# Patient Record
Sex: Female | Born: 1970 | Race: Black or African American | Hispanic: No | Smoking: Never smoker
Health system: Southern US, Community
[De-identification: ages and names within clinical notes are randomized; demographics above are authoritative.]

## PROBLEM LIST (undated history)

## (undated) DIAGNOSIS — D649 Anemia, unspecified: Secondary | ICD-10-CM

## (undated) DIAGNOSIS — M1990 Unspecified osteoarthritis, unspecified site: Secondary | ICD-10-CM

## (undated) DIAGNOSIS — C801 Malignant (primary) neoplasm, unspecified: Secondary | ICD-10-CM

## (undated) DIAGNOSIS — N189 Chronic kidney disease, unspecified: Secondary | ICD-10-CM

## (undated) HISTORY — PX: IRRIGATION AND DEBRIDEMENT SEBACEOUS CYST: SHX5255

## (undated) HISTORY — PX: OTHER SURGICAL HISTORY: SHX169

---

## 1997-10-07 ENCOUNTER — Ambulatory Visit (HOSPITAL_BASED_OUTPATIENT_CLINIC_OR_DEPARTMENT_OTHER): Admission: RE | Admit: 1997-10-07 | Discharge: 1997-10-07 | Payer: Self-pay | Admitting: Plastic Surgery

## 1997-10-26 ENCOUNTER — Ambulatory Visit (HOSPITAL_BASED_OUTPATIENT_CLINIC_OR_DEPARTMENT_OTHER): Admission: RE | Admit: 1997-10-26 | Discharge: 1997-10-26 | Payer: Self-pay | Admitting: Plastic Surgery

## 1998-01-12 ENCOUNTER — Ambulatory Visit (HOSPITAL_COMMUNITY): Admission: RE | Admit: 1998-01-12 | Discharge: 1998-01-12 | Payer: Self-pay | Admitting: Gastroenterology

## 1998-03-20 ENCOUNTER — Inpatient Hospital Stay (HOSPITAL_COMMUNITY): Admission: RE | Admit: 1998-03-20 | Discharge: 1998-03-22 | Payer: Self-pay | Admitting: Plastic Surgery

## 1998-04-06 ENCOUNTER — Ambulatory Visit: Admission: RE | Admit: 1998-04-06 | Discharge: 1998-04-06 | Payer: Self-pay | Admitting: Plastic Surgery

## 1998-04-21 ENCOUNTER — Inpatient Hospital Stay (HOSPITAL_COMMUNITY): Admission: RE | Admit: 1998-04-21 | Discharge: 1998-04-25 | Payer: Self-pay | Admitting: Plastic Surgery

## 1999-02-06 ENCOUNTER — Encounter: Payer: Self-pay | Admitting: *Deleted

## 1999-02-06 ENCOUNTER — Encounter: Admission: RE | Admit: 1999-02-06 | Discharge: 1999-02-06 | Payer: Self-pay | Admitting: *Deleted

## 2001-05-18 ENCOUNTER — Other Ambulatory Visit: Admission: RE | Admit: 2001-05-18 | Discharge: 2001-05-18 | Payer: Self-pay | Admitting: *Deleted

## 2002-05-21 ENCOUNTER — Other Ambulatory Visit: Admission: RE | Admit: 2002-05-21 | Discharge: 2002-05-21 | Payer: Self-pay | Admitting: Obstetrics & Gynecology

## 2003-08-26 ENCOUNTER — Encounter: Admission: RE | Admit: 2003-08-26 | Discharge: 2003-08-26 | Payer: Self-pay | Admitting: Obstetrics & Gynecology

## 2008-01-22 HISTORY — PX: LAPAROSCOPIC GASTRIC BANDING: SHX1100

## 2008-09-05 ENCOUNTER — Ambulatory Visit (HOSPITAL_COMMUNITY): Admission: RE | Admit: 2008-09-05 | Discharge: 2008-09-05 | Payer: Self-pay | Admitting: General Surgery

## 2008-09-07 ENCOUNTER — Encounter: Admission: RE | Admit: 2008-09-07 | Discharge: 2008-11-24 | Payer: Self-pay | Admitting: General Surgery

## 2008-12-12 ENCOUNTER — Ambulatory Visit (HOSPITAL_COMMUNITY): Admission: RE | Admit: 2008-12-12 | Discharge: 2008-12-12 | Payer: Self-pay | Admitting: General Surgery

## 2009-01-02 ENCOUNTER — Encounter: Admission: RE | Admit: 2009-01-02 | Discharge: 2009-01-18 | Payer: Self-pay | Admitting: General Surgery

## 2009-02-20 ENCOUNTER — Encounter: Admission: RE | Admit: 2009-02-20 | Discharge: 2009-05-21 | Payer: Self-pay | Admitting: General Surgery

## 2009-04-10 ENCOUNTER — Encounter: Admission: RE | Admit: 2009-04-10 | Discharge: 2009-07-09 | Payer: Self-pay | Admitting: General Surgery

## 2010-04-25 LAB — COMPREHENSIVE METABOLIC PANEL
AST: 17 U/L (ref 0–37)
Albumin: 3.4 g/dL — ABNORMAL LOW (ref 3.5–5.2)
CO2: 28 mEq/L (ref 19–32)
Calcium: 9.1 mg/dL (ref 8.4–10.5)
Creatinine, Ser: 0.63 mg/dL (ref 0.4–1.2)
GFR calc Af Amer: >60 mL/min (ref >60)
GFR calc non Af Amer: >60 mL/min (ref >60)
Total Protein: 7.8 g/dL (ref 6.0–8.3)

## 2010-04-25 LAB — DIFFERENTIAL
Basophils Relative: 1 % (ref 0–1)
Eosinophils Absolute: 0.1 10*3/uL (ref 0.0–0.7)
Lymphocytes Relative: 34 % (ref 12–46)
Monocytes Relative: 9 % (ref 3–12)
Neutrophils Relative %: 54 % (ref 43–77)

## 2010-04-25 LAB — PREGNANCY, URINE: Preg Test, Ur: NEGATIVE

## 2010-04-25 LAB — CBC
HCT: 31 % — ABNORMAL LOW (ref 36.0–46.0)
Hemoglobin: 9.8 g/dL — ABNORMAL LOW (ref 12.0–15.0)
MCHC: 31.6 g/dL (ref 30.0–36.0)
MCV: 69.1 fL — ABNORMAL LOW (ref 78.0–100.0)
Platelets: 221 10*3/uL (ref 150–400)
RBC: 4.49 MIL/uL (ref 3.87–5.11)
RDW: 20.9 % — ABNORMAL HIGH (ref 11.5–15.5)
WBC: 6.5 10*3/uL (ref 4.0–10.5)

## 2011-04-29 ENCOUNTER — Encounter (HOSPITAL_COMMUNITY): Payer: Self-pay | Admitting: *Deleted

## 2011-04-29 ENCOUNTER — Emergency Department (HOSPITAL_COMMUNITY): Payer: No Typology Code available for payment source

## 2011-04-29 ENCOUNTER — Inpatient Hospital Stay (HOSPITAL_COMMUNITY)
Admission: EM | Admit: 2011-04-29 | Discharge: 2011-05-06 | DRG: 493 | Disposition: A | Discharge status: Rehabilitation Facility | Payer: No Typology Code available for payment source | Attending: General Surgery | Admitting: General Surgery

## 2011-04-29 DIAGNOSIS — R011 Cardiac murmur, unspecified: Secondary | ICD-10-CM

## 2011-04-29 DIAGNOSIS — S2249XA Multiple fractures of ribs, unspecified side, initial encounter for closed fracture: Secondary | ICD-10-CM | POA: Diagnosis present

## 2011-04-29 DIAGNOSIS — S0181XA Laceration without foreign body of other part of head, initial encounter: Secondary | ICD-10-CM

## 2011-04-29 DIAGNOSIS — Z9884 Bariatric surgery status: Secondary | ICD-10-CM

## 2011-04-29 DIAGNOSIS — Y92410 Unspecified street and highway as the place of occurrence of the external cause: Secondary | ICD-10-CM

## 2011-04-29 DIAGNOSIS — S270XXA Traumatic pneumothorax, initial encounter: Secondary | ICD-10-CM

## 2011-04-29 DIAGNOSIS — S8251XA Displaced fracture of medial malleolus of right tibia, initial encounter for closed fracture: Secondary | ICD-10-CM | POA: Diagnosis not present

## 2011-04-29 DIAGNOSIS — S42409A Unspecified fracture of lower end of unspecified humerus, initial encounter for closed fracture: Principal | ICD-10-CM | POA: Diagnosis present

## 2011-04-29 DIAGNOSIS — S8253XA Displaced fracture of medial malleolus of unspecified tibia, initial encounter for closed fracture: Secondary | ICD-10-CM | POA: Diagnosis present

## 2011-04-29 DIAGNOSIS — S27329A Contusion of lung, unspecified, initial encounter: Secondary | ICD-10-CM | POA: Diagnosis present

## 2011-04-29 DIAGNOSIS — S27321A Contusion of lung, unilateral, initial encounter: Secondary | ICD-10-CM | POA: Diagnosis present

## 2011-04-29 DIAGNOSIS — R55 Syncope and collapse: Secondary | ICD-10-CM | POA: Diagnosis present

## 2011-04-29 DIAGNOSIS — S0180XA Unspecified open wound of other part of head, initial encounter: Secondary | ICD-10-CM | POA: Diagnosis present

## 2011-04-29 DIAGNOSIS — S2241XA Multiple fractures of ribs, right side, initial encounter for closed fracture: Secondary | ICD-10-CM | POA: Diagnosis present

## 2011-04-29 DIAGNOSIS — S42402A Unspecified fracture of lower end of left humerus, initial encounter for closed fracture: Secondary | ICD-10-CM | POA: Diagnosis present

## 2011-04-29 DIAGNOSIS — D62 Acute posthemorrhagic anemia: Secondary | ICD-10-CM | POA: Diagnosis not present

## 2011-04-29 DIAGNOSIS — S060X0A Concussion without loss of consciousness, initial encounter: Secondary | ICD-10-CM | POA: Diagnosis present

## 2011-04-29 DIAGNOSIS — E876 Hypokalemia: Secondary | ICD-10-CM | POA: Diagnosis present

## 2011-04-29 HISTORY — DX: Malignant (primary) neoplasm, unspecified: C80.1

## 2011-04-29 HISTORY — DX: Anemia, unspecified: D64.9

## 2011-04-29 HISTORY — DX: Chronic kidney disease, unspecified: N18.9

## 2011-04-29 LAB — DIFFERENTIAL
Basophils Absolute: 0 10*3/uL (ref 0.0–0.1)
Lymphocytes Relative: 8 % — ABNORMAL LOW (ref 12–46)
Monocytes Relative: 7 % (ref 3–12)
Neutro Abs: 12.2 10*3/uL — ABNORMAL HIGH (ref 1.7–7.7)

## 2011-04-29 LAB — URINE MICROSCOPIC-ADD ON

## 2011-04-29 LAB — URINALYSIS, ROUTINE W REFLEX MICROSCOPIC
Nitrite: NEGATIVE
Specific Gravity, Urine: 1.023 (ref 1.005–1.030)
Urobilinogen, UA: 0.2 mg/dL (ref 0.0–1.0)

## 2011-04-29 LAB — POCT I-STAT, CHEM 8
BUN: 4 mg/dL — ABNORMAL LOW (ref 6–23)
Calcium, Ion: 1.14 mmol/L (ref 1.12–1.32)
Chloride: 103 mEq/L (ref 96–112)
Creatinine, Ser: 0.8 mg/dL (ref 0.50–1.10)
Glucose, Bld: 108 mg/dL — ABNORMAL HIGH (ref 70–99)

## 2011-04-29 LAB — CBC
HCT: 32.8 % — ABNORMAL LOW (ref 36.0–46.0)
Platelets: 162 10*3/uL (ref 150–400)
RDW: 14.6 % (ref 11.5–15.5)
WBC: 14.4 10*3/uL — ABNORMAL HIGH (ref 4.0–10.5)

## 2011-04-29 MED ORDER — SODIUM CHLORIDE 0.9 % IV SOLN
INTRAVENOUS | Status: DC
  Administered 2011-04-29: 20:00:00 via INTRAVENOUS

## 2011-04-29 MED ORDER — IOHEXOL 300 MG/ML  SOLN
100.0000 mL | Freq: Once | INTRAMUSCULAR | Status: AC | PRN
  Administered 2011-04-29: 100 mL via INTRAVENOUS

## 2011-04-29 MED ORDER — MORPHINE SULFATE 4 MG/ML IJ SOLN
4.0000 mg | Freq: Once | INTRAMUSCULAR | Status: AC
  Administered 2011-04-29: 4 mg via INTRAVENOUS
  Filled 2011-04-29: qty 1

## 2011-04-29 MED ORDER — SODIUM CHLORIDE 0.9 % IV BOLUS (SEPSIS)
500.0000 mL | Freq: Once | INTRAVENOUS | Status: AC
  Administered 2011-04-29: 500 mL via INTRAVENOUS

## 2011-04-29 MED ORDER — IOHEXOL 300 MG/ML  SOLN: 100.0000 mL | Freq: Once | INTRAMUSCULAR | Status: DC | PRN

## 2011-04-29 MED ORDER — HYDROMORPHONE HCL PF 1 MG/ML IJ SOLN
1.0000 mg | Freq: Once | INTRAMUSCULAR | Status: AC
  Administered 2011-04-29: 1 mg via INTRAVENOUS
  Filled 2011-04-29: qty 1
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
## 2011-04-29 NOTE — Progress Notes (Signed)
Orthopedic Tech Progress Note Patient Details:  Patty [name] January 12, 1971 [id number]  Other Ortho Devices Type of Ortho Device: Other (comment) (foam arm sling) Ortho Device Location: left arm Ortho Device Interventions: Application   Basilio Meadow 04/29/2011, 10:40 PM
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
## 2011-04-30 ENCOUNTER — Inpatient Hospital Stay (HOSPITAL_COMMUNITY): Payer: No Typology Code available for payment source

## 2011-04-30 ENCOUNTER — Other Ambulatory Visit: Payer: Self-pay | Admitting: Orthopedic Surgery

## 2011-04-30 ENCOUNTER — Encounter (HOSPITAL_COMMUNITY): Payer: Self-pay | Admitting: *Deleted

## 2011-04-30 DIAGNOSIS — S2241XA Multiple fractures of ribs, right side, initial encounter for closed fracture: Secondary | ICD-10-CM | POA: Diagnosis present

## 2011-04-30 DIAGNOSIS — S27321A Contusion of lung, unilateral, initial encounter: Secondary | ICD-10-CM | POA: Diagnosis present

## 2011-04-30 DIAGNOSIS — S8251XA Displaced fracture of medial malleolus of right tibia, initial encounter for closed fracture: Secondary | ICD-10-CM | POA: Diagnosis not present

## 2011-04-30 DIAGNOSIS — D62 Acute posthemorrhagic anemia: Secondary | ICD-10-CM | POA: Diagnosis not present

## 2011-04-30 DIAGNOSIS — R55 Syncope and collapse: Secondary | ICD-10-CM | POA: Diagnosis present

## 2011-04-30 DIAGNOSIS — S42402A Unspecified fracture of lower end of left humerus, initial encounter for closed fracture: Secondary | ICD-10-CM | POA: Diagnosis present

## 2011-04-30 LAB — COMPREHENSIVE METABOLIC PANEL
AST: 42 U/L — ABNORMAL HIGH (ref 0–37)
Albumin: 3.4 g/dL — ABNORMAL LOW (ref 3.5–5.2)
Alkaline Phosphatase: 49 U/L (ref 39–117)
BUN: 6 mg/dL (ref 6–23)
CO2: 26 mEq/L (ref 19–32)
Chloride: 104 mEq/L (ref 96–112)
Potassium: 3.3 mEq/L — ABNORMAL LOW (ref 3.5–5.1)
Total Bilirubin: 0.5 mg/dL (ref 0.3–1.2)

## 2011-04-30 LAB — CBC
MCH: 27 pg (ref 26.0–34.0)
MCH: 26.4 pg (ref 26.0–34.0)
MCV: 80.2 fL (ref 78.0–100.0)
Platelets: 180 10*3/uL (ref 150–400)
Platelets: 172 10*3/uL (ref 150–400)
RBC: 3.78 MIL/uL — ABNORMAL LOW (ref 3.87–5.11)
RDW: 14.6 % (ref 11.5–15.5)
WBC: 8.5 10*3/uL (ref 4.0–10.5)

## 2011-04-30 LAB — SURGICAL PCR SCREEN
MRSA, PCR: NEGATIVE
Staphylococcus aureus: NEGATIVE

## 2011-04-30 MED ORDER — SODIUM CHLORIDE 0.9 % IV SOLN: INTRAVENOUS | Status: DC

## 2011-04-30 MED ORDER — ONDANSETRON HCL 4 MG/2ML IJ SOLN: 4.0000 mg | Freq: Four times a day (QID) | INTRAMUSCULAR | Status: DC | PRN

## 2011-04-30 MED ORDER — FENTANYL CITRATE 0.05 MG/ML IJ SOLN
50.0000 ug | INTRAMUSCULAR | Status: DC | PRN
  Administered 2011-05-01 (×2): 50 ug via INTRAVENOUS

## 2011-04-30 MED ORDER — MORPHINE SULFATE 4 MG/ML IJ SOLN
4.0000 mg | INTRAMUSCULAR | Status: DC | PRN
  Administered 2011-04-30: 4 mg via INTRAVENOUS
  Filled 2011-04-30: qty 1

## 2011-04-30 MED ORDER — OXYCODONE HCL 5 MG PO TABS
5.0000 mg | ORAL_TABLET | ORAL | Status: DC | PRN
  Administered 2011-04-30: 10 mg via ORAL
  Administered 2011-05-01 – 2011-05-06 (×24): 15 mg via ORAL
  Filled 2011-04-30 (×7): qty 3
  Filled 2011-04-30: qty 2
  Filled 2011-04-30 (×17): qty 3

## 2011-04-30 MED ORDER — OXYCODONE HCL 5 MG PO TABS: 5.0000 mg | ORAL_TABLET | ORAL | Status: DC | PRN

## 2011-04-30 MED ORDER — MIDAZOLAM HCL 2 MG/2ML IJ SOLN: 0.5000 mg | INTRAMUSCULAR | Status: DC | PRN

## 2011-04-30 MED ORDER — MORPHINE SULFATE 2 MG/ML IJ SOLN
2.0000 mg | INTRAMUSCULAR | Status: DC | PRN
Start: 2011-04-30 — End: 2011-05-03
  Administered 2011-04-30 – 2011-05-03 (×11): 2 mg via INTRAVENOUS
  Filled 2011-04-30 (×11): qty 1

## 2011-04-30 MED ORDER — ENOXAPARIN SODIUM 40 MG/0.4ML ~~LOC~~ SOLN
40.0000 mg | SUBCUTANEOUS | Status: DC
  Administered 2011-04-30 – 2011-05-05 (×5): 40 mg via SUBCUTANEOUS
  Filled 2011-04-30 (×7): qty 0.4

## 2011-04-30 NOTE — Progress Notes (Signed)
Patient seen and examined.  Agree with PA's note.  
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
## 2011-04-30 NOTE — Progress Notes (Signed)
X-ray revealed transverse fracture of the medial malleolus on the right.  I called Dr. Ave Filter with Orthopedics who will evaluate the patient tonight.  She is NPO after midnight already for Dr. Ronie Spies surgery tomorrow.  Patty [name]. Corliss Skains, MD, [facility]  04/30/2011 8:03 PM
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
## 2011-04-30 NOTE — Progress Notes (Signed)
UR completed 

## 2011-05-01 ENCOUNTER — Inpatient Hospital Stay (HOSPITAL_COMMUNITY): Payer: No Typology Code available for payment source | Admitting: Anesthesiology

## 2011-05-01 ENCOUNTER — Encounter (HOSPITAL_COMMUNITY): Payer: Self-pay | Admitting: Anesthesiology

## 2011-05-01 ENCOUNTER — Encounter (HOSPITAL_COMMUNITY): Admission: EM | Disposition: A | Discharge status: Rehabilitation Facility | Payer: Self-pay | Source: Home / Self Care

## 2011-05-01 DIAGNOSIS — R072 Precordial pain: Secondary | ICD-10-CM

## 2011-05-01 HISTORY — PX: ORIF HUMERUS FRACTURE: SHX2126

## 2011-05-01 HISTORY — PX: ORIF ANKLE FRACTURE: SHX5408

## 2011-05-01 LAB — CBC
MCHC: 33.6 g/dL (ref 30.0–36.0)
RDW: 15 % (ref 11.5–15.5)

## 2011-05-01 SURGERY — OPEN REDUCTION INTERNAL FIXATION (ORIF) DISTAL HUMERUS FRACTURE
Anesthesia: General | Site: Leg Lower | Laterality: Right | Wound class: Clean

## 2011-05-01 MED ORDER — CEFAZOLIN SODIUM 1-5 GM-% IV SOLN
INTRAVENOUS | Status: DC | PRN
  Administered 2011-05-01: 2 g via INTRAVENOUS

## 2011-05-01 MED ORDER — 0.9 % SODIUM CHLORIDE (POUR BTL) OPTIME
TOPICAL | Status: DC | PRN
  Administered 2011-05-01: 1000 mL

## 2011-05-01 MED ORDER — VECURONIUM BROMIDE 10 MG IV SOLR
INTRAVENOUS | Status: DC | PRN
  Administered 2011-05-01: 1 mg via INTRAVENOUS
  Administered 2011-05-01: 2 mg via INTRAVENOUS
  Administered 2011-05-01: 1 mg via INTRAVENOUS

## 2011-05-01 MED ORDER — ONDANSETRON HCL 4 MG/2ML IJ SOLN: 4.0000 mg | Freq: Once | INTRAMUSCULAR | Status: DC | PRN

## 2011-05-01 MED ORDER — PROPOFOL 10 MG/ML IV EMUL
INTRAVENOUS | Status: DC | PRN
  Administered 2011-05-01: 150 mg via INTRAVENOUS

## 2011-05-01 MED ORDER — MIDAZOLAM HCL 5 MG/5ML IJ SOLN
INTRAMUSCULAR | Status: DC | PRN
  Administered 2011-05-01: 2 mg via INTRAVENOUS

## 2011-05-01 MED ORDER — MORPHINE SULFATE 4 MG/ML IJ SOLN: 0.0500 mg/kg | INTRAMUSCULAR | Status: DC | PRN

## 2011-05-01 MED ORDER — ONDANSETRON HCL 4 MG/2ML IJ SOLN
INTRAMUSCULAR | Status: DC | PRN
  Administered 2011-05-01: 4 mg via INTRAVENOUS

## 2011-05-01 MED ORDER — GLYCOPYRROLATE 0.2 MG/ML IJ SOLN
INTRAMUSCULAR | Status: DC | PRN
  Administered 2011-05-01: .8 mg via INTRAVENOUS

## 2011-05-01 MED ORDER — NEOSTIGMINE METHYLSULFATE 1 MG/ML IJ SOLN
INTRAMUSCULAR | Status: DC | PRN
  Administered 2011-05-01: 4 mg via INTRAVENOUS

## 2011-05-01 MED ORDER — ROCURONIUM BROMIDE 100 MG/10ML IV SOLN
INTRAVENOUS | Status: DC | PRN
  Administered 2011-05-01: 40 mg via INTRAVENOUS
  Administered 2011-05-01: 10 mg via INTRAVENOUS

## 2011-05-01 MED ORDER — FENTANYL CITRATE 0.05 MG/ML IJ SOLN
INTRAMUSCULAR | Status: DC | PRN
  Administered 2011-05-01 (×2): 50 ug via INTRAVENOUS
  Administered 2011-05-01 (×2): 100 ug via INTRAVENOUS
  Administered 2011-05-01 (×2): 50 ug via INTRAVENOUS

## 2011-05-01 MED ORDER — CEFAZOLIN SODIUM 1-5 GM-% IV SOLN
1.0000 g | INTRAVENOUS | Status: DC
  Filled 2011-05-01: qty 50

## 2011-05-01 MED ORDER — LACTATED RINGERS IV SOLN
INTRAVENOUS | Status: DC | PRN
  Administered 2011-05-01 (×3): via INTRAVENOUS

## 2011-05-01 MED ORDER — HYDROMORPHONE HCL PF 1 MG/ML IJ SOLN
INTRAMUSCULAR | Status: AC
  Filled 2011-05-01: qty 1

## 2011-05-01 MED ORDER — FENTANYL CITRATE 0.05 MG/ML IJ SOLN
INTRAMUSCULAR | Status: AC
  Filled 2011-05-01: qty 2

## 2011-05-01 MED ORDER — CHLORHEXIDINE GLUCONATE 4 % EX LIQD: 60.0000 mL | Freq: Once | CUTANEOUS | Status: DC

## 2011-05-01 MED ORDER — FENTANYL CITRATE 0.05 MG/ML IJ SOLN: 100.0000 ug | Freq: Once | INTRAMUSCULAR | Status: DC

## 2011-05-01 MED ORDER — HYDROMORPHONE HCL PF 1 MG/ML IJ SOLN
0.2500 mg | INTRAMUSCULAR | Status: DC | PRN
  Administered 2011-05-01 (×4): 0.5 mg via INTRAVENOUS

## 2011-05-01 SURGICAL SUPPLY — 91 items
18gauge wire (Wire) ×1 IMPLANT
3.5MD screw 44mm ×1 IMPLANT
BANDAGE ELASTIC 3 VELCRO ST LF (GAUZE/BANDAGES/DRESSINGS) ×3 IMPLANT
BANDAGE ELASTIC 4 VELCRO ST LF (GAUZE/BANDAGES/DRESSINGS) ×6 IMPLANT
BANDAGE ELASTIC 6 VELCRO ST LF (GAUZE/BANDAGES/DRESSINGS) ×3 IMPLANT
BANDAGE ESMARK 6X9 LF (GAUZE/BANDAGES/DRESSINGS) ×2 IMPLANT
BANDAGE GAUZE ELAST BULKY 4 IN (GAUZE/BANDAGES/DRESSINGS) ×6 IMPLANT
BIT DRILL 2.5X2.75 QC CALB (BIT) ×2 IMPLANT
BIT DRILL 2.9 CANN QC NONSTRL (BIT) ×1 IMPLANT
BIT DRILL CALIBRATED 2.7 (BIT) ×2 IMPLANT
BIT DRILL CANN 2.7X625 NONSTRL (BIT) ×2 IMPLANT
BLADE AVERAGE 25X9 (BLADE) ×1 IMPLANT
BNDG CMPR 9X4 STRL LF SNTH (GAUZE/BANDAGES/DRESSINGS) ×2
BNDG CMPR 9X6 STRL LF SNTH (GAUZE/BANDAGES/DRESSINGS) ×2
BNDG ESMARK 4X9 LF (GAUZE/BANDAGES/DRESSINGS) ×3 IMPLANT
BNDG ESMARK 6X9 LF (GAUZE/BANDAGES/DRESSINGS) ×3
CLOTH BEACON ORANGE TIMEOUT ST (SAFETY) ×6 IMPLANT
CORDS BIPOLAR (ELECTRODE) ×3 IMPLANT
COVER SURGICAL LIGHT HANDLE (MISCELLANEOUS) ×6 IMPLANT
CUFF TOURNIQUET SINGLE 18IN (TOURNIQUET CUFF) IMPLANT
CUFF TOURNIQUET SINGLE 24IN (TOURNIQUET CUFF) IMPLANT
CUFF TOURNIQUET SINGLE 34IN LL (TOURNIQUET CUFF) IMPLANT
CUFF TOURNIQUET SINGLE 44IN (TOURNIQUET CUFF) IMPLANT
DECANTER SPIKE VIAL GLASS SM (MISCELLANEOUS) IMPLANT
DRAPE OEC MINIVIEW 54X84 (DRAPES) IMPLANT
DRAPE SURG 17X23 STRL (DRAPES) ×3 IMPLANT
DRAPE U-SHAPE 47X51 STRL (DRAPES) ×3 IMPLANT
DRSG PAD ABDOMINAL 8X10 ST (GAUZE/BANDAGES/DRESSINGS) ×3 IMPLANT
DURAPREP 26ML APPLICATOR (WOUND CARE) ×6 IMPLANT
ELECT REM PT RETURN 9FT ADLT (ELECTROSURGICAL) ×3
ELECTRODE REM PT RTRN 9FT ADLT (ELECTROSURGICAL) ×2 IMPLANT
GAUZE XEROFORM 1X8 LF (GAUZE/BANDAGES/DRESSINGS) ×6 IMPLANT
GLOVE BIO SURGEON STRL SZ7 (GLOVE) ×3 IMPLANT
GLOVE BIO SURGEON STRL SZ7.5 (GLOVE) ×3 IMPLANT
GLOVE BIO SURGEON STRL SZ8.5 (GLOVE) ×3 IMPLANT
GLOVE BIOGEL PI IND STRL 8 (GLOVE) ×2 IMPLANT
GLOVE BIOGEL PI INDICATOR 8 (GLOVE) ×1
GOWN SRG XL XLNG 56XLVL 4 (GOWN DISPOSABLE) ×2 IMPLANT
GOWN STRL NON-REIN LRG LVL3 (GOWN DISPOSABLE) ×12 IMPLANT
GOWN STRL NON-REIN XL XLG LVL4 (GOWN DISPOSABLE) ×3
GUIDEWIRE THREADED 150MM (WIRE) ×1 IMPLANT
K-WIRE ACE 1.6X6 (WIRE) ×6
K-WIRE FIXATION 2.0X6 (WIRE) ×15
KIT BASIN OR (CUSTOM PROCEDURE TRAY) ×6 IMPLANT
KIT ROOM TURNOVER OR (KITS) ×6 IMPLANT
KWIRE ACE 1.6X6 (WIRE) IMPLANT
KWIRE FIXATION 2.0X6 (WIRE) IMPLANT
MANIFOLD NEPTUNE II (INSTRUMENTS) ×6 IMPLANT
NDL HYPO 25GX1X1/2 BEV (NEEDLE) IMPLANT
NEEDLE 22X1 1/2 (OR ONLY) (NEEDLE) IMPLANT
NEEDLE HYPO 25GX1X1/2 BEV (NEEDLE) IMPLANT
NS IRRIG 1000ML POUR BTL (IV SOLUTION) ×6 IMPLANT
PACK ORTHO EXTREMITY (CUSTOM PROCEDURE TRAY) ×6 IMPLANT
PAD ARMBOARD 7.5X6 YLW CONV (MISCELLANEOUS) ×12 IMPLANT
PAD CAST 3X4 CTTN HI CHSV (CAST SUPPLIES) ×2 IMPLANT
PAD CAST 4YDX4 CTTN HI CHSV (CAST SUPPLIES) ×6 IMPLANT
PADDING CAST COTTON 3X4 STRL (CAST SUPPLIES) ×3
PADDING CAST COTTON 4X4 STRL (CAST SUPPLIES) ×9
PLATE DIST HUM MEDIAL LT SM (Plate) ×1 IMPLANT
PLATE LOCK LT SM (Plate) ×1 IMPLANT
SCREW  RD HEAD THR 4.0 34 LTH (Screw) ×1 IMPLANT
SCREW 3.5MM CORT LP 34MM (Screw) ×1 IMPLANT
SCREW CANN TI S-THRD 4.0X36 (Screw) ×1 IMPLANT
SCREW CORT 3.5X40 (Screw) ×1 IMPLANT
SCREW CORT T15 28X3.5XST LCK (Screw) IMPLANT
SCREW CORTICAL 3.5X28MM (Screw) ×6 IMPLANT
SCREW CORTICAL LOW PROF 3.5X20 (Screw) ×1 IMPLANT
SCREW LOCK CORT STAR 3.5X16 (Screw) ×1 IMPLANT
SCREW LOCK CORT STAR 3.5X20 (Screw) ×1 IMPLANT
SCREW LOCK CORT STAR 3.5X22 (Screw) ×1 IMPLANT
SCREW LOCK CORT STAR 3.5X28 (Screw) ×1 IMPLANT
SCREW LOCK CORT STAR 3.5X30 (Screw) ×1 IMPLANT
SCREW LOW PROFILE 18MMX3.5MM (Screw) ×1 IMPLANT
SCREW LOW PROFILE 22MMX3.5MM (Screw) ×2 IMPLANT
SPONGE GAUZE 4X4 12PLY (GAUZE/BANDAGES/DRESSINGS) ×6 IMPLANT
SPONGE LAP 4X18 X RAY DECT (DISPOSABLE) ×12 IMPLANT
SPONGE SCRUB IODOPHOR (GAUZE/BANDAGES/DRESSINGS) ×3 IMPLANT
STAPLER VISISTAT 35W (STAPLE) ×3 IMPLANT
STRIP CLOSURE SKIN 1/2X4 (GAUZE/BANDAGES/DRESSINGS) IMPLANT
SUCTION FRAZIER TIP 10 FR DISP (SUCTIONS) ×3 IMPLANT
SUT STEEL 7 (SUTURE) ×1 IMPLANT
SUT VIC AB 2-0 CTB1 (SUTURE) ×3 IMPLANT
SUT VIC AB 3-0 FS2 27 (SUTURE) ×6 IMPLANT
SYR CONTROL 10ML LL (SYRINGE) IMPLANT
TOWEL OR 17X24 6PK STRL BLUE (TOWEL DISPOSABLE) ×6 IMPLANT
TOWEL OR 17X26 10 PK STRL BLUE (TOWEL DISPOSABLE) ×6 IMPLANT
TUBE CONNECTING 12X1/4 (SUCTIONS) ×3 IMPLANT
UNDERPAD 30X30 INCONTINENT (UNDERPADS AND DIAPERS) ×3 IMPLANT
WASHER 3.5MM (Orthopedic Implant) ×1 IMPLANT
WATER STERILE IRR 1000ML POUR (IV SOLUTION) ×6 IMPLANT
YANKAUER SUCT BULB TIP NO VENT (SUCTIONS) IMPLANT

## 2011-05-01 NOTE — Transfer of Care (Signed)
Immediate Anesthesia Transfer of Care Note  Patient: Patty [name]  Procedure(s) Performed: Procedure(s) (LRB): OPEN REDUCTION INTERNAL FIXATION (ORIF) DISTAL HUMERUS FRACTURE (Left) OPEN REDUCTION INTERNAL FIXATION (ORIF) ANKLE FRACTURE (Right)  Patient Location: PACU  Anesthesia Type: General  Level of Consciousness: awake and patient cooperative  Airway & Oxygen Therapy: Patient Spontanous Breathing and Patient connected to face mask oxygen  Post-op Assessment: Report given to PACU RN  Post vital signs: Reviewed and stable  Complications: No apparent anesthesia complications

## 2011-05-01 NOTE — OR Nursing (Signed)
Open Reduction Internal Fixation right ankle, times: start 1320, end:1342.
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
## 2011-05-01 NOTE — Anesthesia Postprocedure Evaluation (Signed)
  Anesthesia Post-op Note  Patient: Patty [name]  Procedure(s) Performed: Procedure(s) (LRB): OPEN REDUCTION INTERNAL FIXATION (ORIF) DISTAL HUMERUS FRACTURE (Left) OPEN REDUCTION INTERNAL FIXATION (ORIF) ANKLE FRACTURE (Right)  Patient Location: PACU  Anesthesia Type: General  Level of Consciousness: awake  Airway and Oxygen Therapy: Patient Spontanous Breathing  Post-op Pain: mild  Post-op Assessment: Post-op Vital signs reviewed  Post-op Vital Signs: Reviewed  Complications: No apparent anesthesia complications
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
## 2011-05-01 NOTE — Brief Op Note (Signed)
04/29/2011 - 05/01/2011  4:33 PM  PATIENT:  Patty [name]  41 y.o. female  PRE-OPERATIVE DIAGNOSIS:  left distal humerus fracture, right ankle fracture  POST-OPERATIVE DIAGNOSIS:  left distal humerus fracture, right ankle fracture  PROCEDURE:  Procedure(s) (LRB): OPEN REDUCTION INTERNAL FIXATION (ORIF) DISTAL HUMERUS FRACTURE (Left) OPEN REDUCTION INTERNAL FIXATION (ORIF) ANKLE FRACTURE (Right)  SURGEON:  Surgeon(s) and Role: Panel 1:    * Marlowe Shores, MD - Primary    * Tami Ribas, MD - Assisting  Panel 2:    * Nestor Lewandowsky, MD - Primary  PHYSICIAN ASSISTANT:   ASSISTANTS: Betha Loa   ANESTHESIA:   general  EBL:  Total I/O In: 2005 [P.O.:5; I.V.:2000] Out: 400 [Urine:400]  BLOOD ADMINISTERED:none  DRAINS: none   LOCAL MEDICATIONS USED:  NONE  SPECIMEN:  No Specimen  DISPOSITION OF SPECIMEN:  N/A  COUNTS:  YES  TOURNIQUET:   Total Tourniquet Time Documented: Upper Arm (Left) - 169 minutes  DICTATION: .Other Dictation: Dictation Number [id number]  PLAN OF CARE: Admit to inpatient   PATIENT DISPOSITION:  PACU - hemodynamically stable.   Delay start of Pharmacological VTE agent (>24hrs) due to surgical blood loss or risk of bleeding: yes

## 2011-05-01 NOTE — Interval H&P Note (Signed)
History and Physical Interval Note:  05/01/2011 7:53 AM  Patty [name]  has presented today for surgery, with the diagnosis of left distal humerus fracture  The various methods of treatment have been discussed with the patient and family. After consideration of risks, benefits and other options for treatment, the patient has consented to  Procedure(s) (LRB): OPEN REDUCTION INTERNAL FIXATION (ORIF) DISTAL HUMERUS FRACTURE (Left) OPEN REDUCTION INTERNAL FIXATION (ORIF) ANKLE FRACTURE (Right) as a surgical intervention .  The patients' history has been reviewed, patient examined, no change in status, stable for surgery.  I have reviewed the patients' chart and labs.  Questions were answered to the patient's satisfaction.     Nestor Lewandowsky

## 2011-05-01 NOTE — Op Note (Signed)
See dictated note [phone]

## 2011-05-01 NOTE — Progress Notes (Signed)
Pt arrived to 4734 from PACU. Pt c/o pain 8/10 located in her LUE.  Pt also c/o hunger.  No diet order placed.  Will notify MD about diet status, will admin pain medication and will con't to monitor.

## 2011-05-01 NOTE — Preoperative (Signed)
Beta Blockers   Reason not to administer Beta Blockers:Not Applicable. No home beta blockers 

## 2011-05-01 NOTE — Discharge Instructions (Signed)
Elbow Fracture with Open Reduction and Internal Fixation (ORIF) A fracture (break in bone) of the elbow means one of the bones that comprises the elbow is broken. If fractures are not displaced (separated), they may be treated conservatively. That means that only a sling or splint may be required for two to three weeks. Often, elbow fractures are treated by early range of motion exercises to prevent the elbow from getting stiff. If fractures are large and not stable, an operation may be required to put the bones back into proper position and hold them in place with:  Pins.   Plates.   Screws.  This is called open reduction and internal fixation (ORIF). The main goal of treating fractured elbows is to get the bones back into position and keep them in place. This goal gives the best chance of an elbow that:  Works as normally as possible.   Has the optimum range of motion.  DIAGNOSIS  The diagnosis of a fractured elbow is made by x-ray. These will be required before and after the elbow is fixed. RISKS AND COMPLICATIONS All surgery is associated with risks. Some of these risks are:  Excessive bleeding.   Failure to heal properly (non-union).   Infection.   Stiffness of elbow following injury.   Damage to one of the nerves around the elbow producing numbness or weakness.  LET YOUR CAREGIVER KNOW ABOUT:  Allergies.   Medications taken including herbs, eye drops, over the counter medications, and creams.   Use of steroids (by mouth or creams).   Previous problems with anesthetics or novocaine.   Any numbness or tingling in your hand/forearm.   Possibility of pregnancy, if this applies.   History of blood clots (thrombophlebitis).   History of bleeding or blood problems.   Previous surgery.   Other health problems.   Family history of anesthetic problems  PROCEDURE  You will be given an anesthetic which will keep you pain free during surgery. This will be accomplished by a  general anesthetic (you go to sleep) or regional anesthesia (your arm is made numb). After the surgery you will be taken to the recovery area where a nurse will monitor your progress. When you are stable, taking fluids well and provided there are no complications, you will be allowed to return to your hospital room or possibly even go home. AFTER THE PROCEDURE   Only take over-the-counter or prescription medicines for pain, discomfort, or fever as directed by your caregiver.   You may use ice for 15 to 20 minutes, 3 to 4 times per day, for the first 2 to 3 days.   Change dressings and see your caregiver as directed.   Your caregiver will instruct you when to begin using and exercising your arm and elbow.  SEEK IMMEDIATE MEDICAL CARE IF:  There is redness, swelling, or increasing pain in the surgical area.   Pus, blood or unusual drainage is coming from the area or is visible on the dressings or cast.   An unexplained oral temperature above 102 F (38.9 C) develops.   You notice a foul smell coming from the surgical site or dressing.   The wound breaks open (edges are not staying together) after stitches have been removed.   You develop increasing pain or increasing pain with motion of your fingers.   There is numbness or tingling in your hand or forearm.   You have any other questions or concerns following surgery.  Document Released: 07/03/2000 Document Revised:  12/27/2010 Document Reviewed: 01/25/2008 [facility] Patient Information 2012 [place], [place].
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
## 2011-05-01 NOTE — Progress Notes (Signed)
PATIENT ID: Patty [name]  MRN: [id number]  DOB/AGE:  03/15/1970 / 41 y.o.    Procedure(s) (LRB): OPEN REDUCTION INTERNAL FIXATION (ORIF) DISTAL HUMERUS FRACTURE (Left) OPEN REDUCTION INTERNAL FIXATION (ORIF) ANKLE FRACTURE (Right)  Subjective: Pain is moderate.  No c/o chest pain or SOB.      Objective: Vital signs in last 24 hours: Temp:  [98.4 F (36.9 C)-99.7 F (37.6 C)] 99.6 F (37.6 C) (04/10 0622) Pulse Rate:  [98-129] 98  (04/10 0622) Resp:  [16-20] 16  (04/10 0622) BP: (106-117)/(64-76) 116/76 mmHg (04/10 0622) SpO2:  [97 %-100 %] 100 % (04/10 0622) Weight:  [85.322 kg (188 lb 1.6 oz)] 85.322 kg (188 lb 1.6 oz) (04/10 0622)  Intake/Output from previous day: 04/09 0701 - 04/10 0700 In: 600 [P.O.:480; I.V.:120] Out: 0  Intake/Output this shift:     Basename 05/01/11 0602 04/30/11 1418 04/30/11 0605 04/29/11 2236 04/29/11 2226  HGB 9.3* 9.6* 10.2* 11.6* 10.9*    Basename 05/01/11 0602 04/30/11 1418  WBC 6.3 8.5  RBC 3.38* 3.63*  HCT 27.7* 29.1*  PLT 143* 172    Basename 04/30/11 1418 04/29/11 2236  NA 140 140  K 3.3* 3.4*  CL 104 103  CO2 26 --  BUN 6 4*  CREATININE 0.58 0.80  GLUCOSE 112* 108*  CALCIUM 8.5 --   No results found for this basename: LABPT:2,INR:2 in the last 72 hours  Physical Exam: Neurologically intact Sensation intact distally Compartment soft  Assessment/Plan:   Procedure(s) (LRB): OPEN REDUCTION INTERNAL FIXATION (ORIF) DISTAL HUMERUS FRACTURE (Left) OPEN REDUCTION INTERNAL FIXATION (ORIF) ANKLE FRACTURE (Right)   X-rays of the proximal fibula were negative. Plan will be for ORIF of her medial malleolus fracture today concurrent with her surgery for her elbow. Risks benefits alternatives were explained. Dr. Turner Daniels will be doing the surgery.   Patty [name] 05/01/2011, 7:43 AM

## 2011-05-01 NOTE — Progress Notes (Signed)
PT CANCELLATION NOTE 05/01/2011  PT session cancelled.  Pt in surgery.  Will follow-up with pt tomorrow.    Taleen Prosser L. Cammi Consalvo DPT [phone]

## 2011-05-01 NOTE — Anesthesia Procedure Notes (Signed)
Procedure Name: Intubation Date/Time: 05/01/2011 12:59 PM Performed by: Mancel Parsons Pre-anesthesia Checklist: Patient identified, Timeout performed, Emergency Drugs available, Suction available and Patient being monitored Patient Re-evaluated:Patient Re-evaluated prior to inductionOxygen Delivery Method: Circle system utilized Preoxygenation: Pre-oxygenation with 100% oxygen Intubation Type: IV induction Ventilation: Mask ventilation without difficulty Laryngoscope Size: Mac and 3 Grade View: Grade I Tube type: Oral Tube size: 7.5 mm Number of attempts: 1 Airway Equipment and Method: Stylet Placement Confirmation: ETT inserted through vocal cords under direct vision,  positive ETCO2 and breath sounds checked- equal and bilateral Secured at: 22 cm Tube secured with: Tape Dental Injury: Teeth and Oropharynx as per pre-operative assessment

## 2011-05-01 NOTE — Progress Notes (Signed)
*  PRELIMINARY RESULTS* Echocardiogram 2D Echocardiogram has been performed.  Glean Salen [facility] 05/01/2011, 9:52 AM

## 2011-05-01 NOTE — Anesthesia Preprocedure Evaluation (Addendum)
Anesthesia Evaluation  Patient identified by MRN, date of birth, ID band Patient awake    Reviewed: Allergy & Precautions, H&P , NPO status , Patient's Chart, lab work & pertinent test results, reviewed documented beta blocker date and time   Airway Mallampati: II TM Distance: >3 FB Neck ROM: Full    Dental  (+) Teeth Intact and Dental Advisory Given   Pulmonary  breath sounds clear to auscultation        Cardiovascular Rhythm:Regular Rate:Normal     Neuro/Psych    GI/Hepatic   Endo/Other    Renal/GU      Musculoskeletal   Abdominal   Peds  Hematology   Anesthesia Other Findings   Reproductive/Obstetrics                          Anesthesia Physical Anesthesia Plan  ASA: II  Anesthesia Plan: General   Post-op Pain Management:    Induction: Intravenous  Airway Management Planned: Oral ETT  Additional Equipment:   Intra-op Plan:   Post-operative Plan: Extubation in OR  Informed Consent: I have reviewed the patients History and Physical, chart, labs and discussed the procedure including the risks, benefits and alternatives for the proposed anesthesia with the patient or authorized representative who has indicated his/her understanding and acceptance.   Dental advisory given  Plan Discussed with: CRNA, Anesthesiologist and Surgeon  Anesthesia Plan Comments:         Anesthesia Quick Evaluation

## 2011-05-01 NOTE — Progress Notes (Signed)
To OR today.  This patient has been seen and I agree with the findings and treatment plan.  Marta Lamas. Gae Bon, MD, FACS [phone] (pager) [phone] (direct pager) Trauma Surgeon
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
## 2011-05-02 ENCOUNTER — Encounter (HOSPITAL_COMMUNITY): Payer: Self-pay | Admitting: Orthopedic Surgery

## 2011-05-02 NOTE — Op Note (Signed)
NAMEDEVYNNE, STURDIVANT NO.:  [id number]  MEDICAL RECORD NO.:  [id number]  LOCATION:  4734                         FACILITY:  [facility]  PHYSICIAN:  Artist Pais. Wilhelmine Krogstad, M.D.DATE OF BIRTH:  1970-04-29  DATE OF PROCEDURE:  05/01/2011 DATE OF DISCHARGE:                              OPERATIVE REPORT   PREOPERATIVE DIAGNOSIS:  Complex intra-articular fracture, left distal humerus.  POSTOPERATIVE DIAGNOSIS:  Complex intra-articular fracture, left distal humerus.  PROCEDURE:  Open reduction and internal fixation above with olecranon osteotomy, decompression of ulnar nerve.  SURGEON:  Artist Pais. Mina Marble, MD.  ASSISTANT:  Betha Loa, MD.  ANESTHESIA:  General.  COMPLICATION:  No complication.  DRAINS:  No drains.  DESCRIPTION OF PROCEDURE:  The patient was taken to the operating suite. After induction of adequate general anesthesia, left upper extremity was prepped and draped in sterile fashion.  Also, during this procedure, Dr. Gean Birchwood fixed a right medial malleolus fracture.  The left upper extremity was prepped and draped in usual sterile fashion.  Esmarch was used to exsanguinate the limb.  Tourniquet was inflated to 250 mmHg.  At this point, toweling was draped across the chest sterilely and placed on top of well-padded Mayo stand.  Posterior approach to the elbow was undertaken.  Skin was incised.  Flaps were raised.  The neurovascular bundle on the ulnar side in the cubital tunnel was identified and unroofed.  The ulnar nerve was carefully dissected free and tagged with a Penrose drain.  Next, an olecranon osteotomy was performed, chevron- shaped from proximal to distal using a saw, and then osteotomes to create the osteotomy.  It was then retracted proximally.  The fascia overlying the triceps muscle medially and laterally was split and this olecranon osteotomy was then pulled proximally to reveal the intra- articular fracture of the distal aspect of  the humerus on the left. Thorough debridement of clot was undertaken at the fracture site.  The articular components were temporarily fixed with K-wires.  Transverse suture recreate the distal aspect of the humerus.  There was significant comminution medially, minimal laterally.  The lateral side was then carefully fixed to the spool using K-wires as well.  Multiple K-wires were used to fix the fracture into near anatomic alignment.  Next, a posterolateral plate was placed along the posterolateral aspect of the distal humerus, fixed the appropriate screws proximally and distally. Once this was done, a transtrochlear screw was placed across the spool from lateral to medial.  A 2nd screw was then placed.  The reduction clamps were then removed.  A medial plate was then fastened to the medial aspect of the fracture including a large butterfly fragment that was secured with a screw from lateral to medial, and the plate from medial to lateral.  All screws were done under direct and fluoroscopic guidance.  Intraoperative fluoroscopy revealed adequate reduction in AP, lateral, and oblique view.  The wound was then thoroughly irrigated. The ulnar nerve was stable in the groove and was not impinging on hardware.  After this was done, the olecranon osteotomy was fixed with a 6.2 K-wires x2, and 18-gauge steel wire in a tension band configuration, this was then placed.  Prior to placement of the tension band wire, the tourniquet was dropped for 10 minutes.  The wound was thoroughly irrigated and debrided.  The tourniquet was then raised again for another 35 minutes while the olecranon osteotomy was fixed.  Soft tissues were closed with 0 Vicryl to reapproximate the fascia of the triceps muscle, 2-0 undyed Vicryl subcutaneously, and staples on the skin.  Xeroform, 4x4s, fluffs, and a compression wrap, as well as a posterior elbow splint was applied.  The patient tolerated the procedure well and went  to the recovery room in stable fashion.     Artist Pais Mina Marble, M.D.     MAW/MEDQ  D:  05/01/2011  T:  05/02/2011  Job:  [id number]
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
## 2011-05-02 NOTE — Progress Notes (Signed)
Physical Therapy Treatment Patient Details Name: Patty [name] MRN: [id number] DOB: Sep 25, 1970 Today's Date: 05/02/2011  PT Assessment/Plan  PT - Assessment/Plan Comments on Treatment Session: CNA on hand to assist with and observe transfer technique.  Pt improved from morning session.   PT Plan: Discharge plan remains appropriate;Frequency remains appropriate PT Frequency: Min 6X/week Recommendations for Other Services: Rehab consult Follow Up Recommendations: Inpatient Rehab Equipment Recommended: Defer to next venue PT Goals  Acute Rehab PT Goals PT Goal Formulation: With patient/family Time For Goal Achievement: 2 weeks Pt will go Supine/Side to Sit: with supervision;with HOB 0 degrees PT Goal: Supine/Side to Sit - Progress: Goal set today Pt will Sit at Edge of Bed: Independently;6-10 min;with no upper extremity support PT Goal: Sit at Edge Of Bed - Progress: Goal set today Pt will go Sit to Supine/Side: with supervision;with HOB 0 degrees PT Goal: Sit to Supine/Side - Progress: Progressing toward goal Pt will go Sit to Stand: with supervision PT Goal: Sit to Stand - Progress: Progressing toward goal Pt will go Stand to Sit: with supervision PT Goal: Stand to Sit - Progress: Progressing toward goal Pt will Transfer Bed to Chair/Chair to Bed: with supervision PT Transfer Goal: Bed to Chair/Chair to Bed - Progress: Progressing toward goal  PT Treatment Precautions/Restrictions  Precautions Precautions: Fall Required Braces or Orthoses: Other Brace/Splint Other Brace/Splint: Sling on LUE, Cam Walker Boot on RLE Restrictions Weight Bearing Restrictions: Yes LUE Weight Bearing: Non weight bearing RLE Weight Bearing: Weight bearing as tolerated Mobility (including Balance) Bed Mobility Bed Mobility: Yes Supine to Sit: Not tested (comment) Supine to Sit Details (indicate cue type and reason): Verbal instruction for technique, Assist for Right LE management and L UE   management secondary to pain.   Sit to Supine: 1: +2 Total assist;HOB flat Sit to Supine - Details (indicate cue type and reason): Assistance for bilateral LE and Left UE management secondary to pain.  Transfers Transfers: Yes Sit to Stand: 2: Max assist;With upper extremity assist;From bed;With armrests (Pt 40%) Sit to Stand Details (indicate cue type and reason): Pt instructed to push up from armrest of chair with RUE. Sling supporting Left UE.  Manual facilitation to assist pt in management of pt's body weight.  Cues for anterior wt shift.  Stand to Sit: 3: Mod assist;To bed Stand to Sit Details: Assist for controlled descent pt cued to place Right hand on Guardrail of the bed.  Stand Pivot Transfers: 2: Max assist;Patient percentage (comment) (Pt 40%) Stand Pivot Transfer Details (indicate cue type and reason): Pt able to advance bilateral LE with less assistance.  Cues for sequencing and assist for balance and stability.  Ambulation/Gait Ambulation/Gait: No Stairs: No Wheelchair Mobility Wheelchair Mobility: No  Posture/Postural Control Posture/Postural Control: No significant limitations Balance Balance Assessed: Yes Static Sitting Balance Static Sitting - Balance Support: Right upper extremity supported;Feet supported Static Sitting - Level of Assistance: 5: Stand by assistance Static Sitting - Comment/# of Minutes: Pt c/o dizziness initially but improved after about 1 minute.  Pt sat on EOB >5 minutes. Exercise    End of Session PT - End of Session Equipment Utilized During Treatment: Gait belt Activity Tolerance: Patient limited by pain Patient left: in bed;with call bell in reach;with bed alarm set;with family/visitor present Nurse Communication: Mobility status for transfers General Behavior During Session: [facility] for tasks performed Cognition: [facility] for tasks performed  Heike Pounds 05/02/2011, 4:29 PM Maryjayne Kleven L. Samira Acero DPT [phone]

## 2011-05-02 NOTE — Progress Notes (Signed)
Subjective: 1 Day Post-Op Procedure(s) (LRB): OPEN REDUCTION INTERNAL FIXATION (ORIF) DISTAL HUMERUS FRACTURE (Left) OPEN REDUCTION INTERNAL FIXATION (ORIF) ANKLE FRACTURE (Right) Patient reports pain as moderate.    Objective: Vital signs in last 24 hours: Temp:  [97.6 F (36.4 C)-98.9 F (37.2 C)] 98.9 F (37.2 C) (04/11 0543) Pulse Rate:  [83-140] 110  (04/11 0543) Resp:  [13-19] 18  (04/11 0543) BP: (113-143)/(65-85) 143/82 mmHg (04/11 0543) SpO2:  [96 %-100 %] 99 % (04/11 0543) Weight:  [91.8 kg (202 lb 6.1 oz)] 91.8 kg (202 lb 6.1 oz) (04/11 0543)  Intake/Output from previous day: 04/10 0701 - 04/11 0700 In: 3205 [P.O.:605; I.V.:2600] Out: 800 [Urine:800] Intake/Output this shift: Total I/O In: 730 [P.O.:660; I.V.:70] Out: 100 [Urine:100]   Basename 05/01/11 0602 04/30/11 1418 04/30/11 0605 04/29/11 2236 04/29/11 2226  HGB 9.3* 9.6* 10.2* 11.6* 10.9*    Basename 05/01/11 0602 04/30/11 1418  WBC 6.3 8.5  RBC 3.38* 3.63*  HCT 27.7* 29.1*  PLT 143* 172    Basename 04/30/11 1418 04/29/11 2236  NA 140 140  K 3.3* 3.4*  CL 104 103  CO2 26 --  BUN 6 4*  CREATININE 0.58 0.80  GLUCOSE 112* 108*  CALCIUM 8.5 --   No results found for this basename: LABPT:2,INR:2 in the last 72 hours  Neurologically intact  Assessment/Plan: 1 Day Post-Op Procedure(s) (LRB): OPEN REDUCTION INTERNAL FIXATION (ORIF) DISTAL HUMERUS FRACTURE (Left) OPEN REDUCTION INTERNAL FIXATION (ORIF) ANKLE FRACTURE (Right) Doing well post op day 1  i will need to see in my office next week  Patty [name] A 05/02/2011, 12:26 PM
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
## 2011-05-03 ENCOUNTER — Encounter (HOSPITAL_COMMUNITY): Payer: Self-pay | Admitting: Physical Medicine and Rehabilitation

## 2011-05-03 ENCOUNTER — Inpatient Hospital Stay (HOSPITAL_COMMUNITY): Payer: No Typology Code available for payment source

## 2011-05-03 DIAGNOSIS — R55 Syncope and collapse: Secondary | ICD-10-CM

## 2011-05-03 DIAGNOSIS — R011 Cardiac murmur, unspecified: Secondary | ICD-10-CM

## 2011-05-03 DIAGNOSIS — S8253XA Displaced fracture of medial malleolus of unspecified tibia, initial encounter for closed fracture: Secondary | ICD-10-CM

## 2011-05-03 DIAGNOSIS — S42309A Unspecified fracture of shaft of humerus, unspecified arm, initial encounter for closed fracture: Secondary | ICD-10-CM

## 2011-05-03 DIAGNOSIS — D62 Acute posthemorrhagic anemia: Secondary | ICD-10-CM

## 2011-05-03 LAB — COMPREHENSIVE METABOLIC PANEL
AST: 25 U/L (ref 0–37)
Albumin: 2.9 g/dL — ABNORMAL LOW (ref 3.5–5.2)
Alkaline Phosphatase: 55 U/L (ref 39–117)
CO2: 31 mEq/L (ref 19–32)
Chloride: 98 mEq/L (ref 96–112)
GFR calc non Af Amer: >90 mL/min (ref >90)
Potassium: 2.7 mEq/L — CL (ref 3.5–5.1)
Total Bilirubin: 0.7 mg/dL (ref 0.3–1.2)

## 2011-05-03 LAB — ABO/RH: ABO/RH(D): O POS

## 2011-05-03 LAB — URINE MICROSCOPIC-ADD ON

## 2011-05-03 LAB — URINALYSIS, ROUTINE W REFLEX MICROSCOPIC
Bilirubin Urine: NEGATIVE
Ketones, ur: NEGATIVE mg/dL
Nitrite: NEGATIVE
Protein, ur: NEGATIVE mg/dL
pH: 6 (ref 5.0–8.0)

## 2011-05-03 LAB — CBC
HCT: 22.5 % — ABNORMAL LOW (ref 36.0–46.0)
HCT: 23.4 % — ABNORMAL LOW (ref 36.0–46.0)
Hemoglobin: 7.7 g/dL — ABNORMAL LOW (ref 12.0–15.0)
MCHC: 32.9 g/dL (ref 30.0–36.0)
Platelets: 154 10*3/uL (ref 150–400)
RBC: 2.76 MIL/uL — ABNORMAL LOW (ref 3.87–5.11)
RBC: 2.87 MIL/uL — ABNORMAL LOW (ref 3.87–5.11)
RDW: 14.6 % (ref 11.5–15.5)
WBC: 9.1 10*3/uL (ref 4.0–10.5)

## 2011-05-03 LAB — PREPARE RBC (CROSSMATCH)

## 2011-05-03 LAB — HEMOGLOBIN AND HEMATOCRIT, BLOOD: Hemoglobin: 7.7 g/dL — ABNORMAL LOW (ref 12.0–15.0)

## 2011-05-03 MED ORDER — ACETAMINOPHEN 325 MG PO TABS
650.0000 mg | ORAL_TABLET | Freq: Four times a day (QID) | ORAL | Status: DC
  Administered 2011-05-03 – 2011-05-06 (×12): 650 mg via ORAL
  Filled 2011-05-03 (×12): qty 2

## 2011-05-03 MED ORDER — DIPHENHYDRAMINE HCL 25 MG PO CAPS
25.0000 mg | ORAL_CAPSULE | Freq: Once | ORAL | Status: AC
  Administered 2011-05-03: 25 mg via ORAL
  Filled 2011-05-03: qty 1

## 2011-05-03 MED ORDER — DIPHENHYDRAMINE HCL 50 MG/ML IJ SOLN
12.5000 mg | Freq: Four times a day (QID) | INTRAMUSCULAR | Status: DC | PRN
  Administered 2011-05-04: 50 mg via INTRAVENOUS
  Administered 2011-05-05: 12.5 mg via INTRAVENOUS
  Filled 2011-05-03 (×2): qty 1

## 2011-05-03 MED ORDER — MAGIC MOUTHWASH
15.0000 mL | Freq: Four times a day (QID) | ORAL | Status: DC | PRN
  Filled 2011-05-03: qty 15

## 2011-05-03 MED ORDER — FERROUS SULFATE 325 (65 FE) MG PO TABS
325.0000 mg | ORAL_TABLET | Freq: Three times a day (TID) | ORAL | Status: DC
  Administered 2011-05-03 – 2011-05-06 (×11): 325 mg via ORAL
  Filled 2011-05-03 (×13): qty 1

## 2011-05-03 MED ORDER — POTASSIUM CHLORIDE 10 MEQ/100ML IV SOLN
10.0000 meq | INTRAVENOUS | Status: AC
  Administered 2011-05-03 (×4): 10 meq via INTRAVENOUS
  Filled 2011-05-03 (×4): qty 100

## 2011-05-03 MED ORDER — MAGNESIUM OXIDE 400 MG PO TABS
400.0000 mg | ORAL_TABLET | Freq: Two times a day (BID) | ORAL | Status: DC
  Administered 2011-05-03 – 2011-05-05 (×4): 400 mg via ORAL
  Filled 2011-05-03 (×5): qty 1

## 2011-05-03 MED ORDER — POTASSIUM CHLORIDE CRYS ER 20 MEQ PO TBCR
40.0000 meq | EXTENDED_RELEASE_TABLET | Freq: Two times a day (BID) | ORAL | Status: AC
  Administered 2011-05-03 – 2011-05-06 (×7): 40 meq via ORAL
  Filled 2011-05-03 (×8): qty 2

## 2011-05-03 MED ORDER — ACETAMINOPHEN 325 MG PO TABS
650.0000 mg | ORAL_TABLET | Freq: Once | ORAL | Status: AC
  Administered 2011-05-03: 650 mg via ORAL
  Filled 2011-05-03: qty 2

## 2011-05-03 MED ORDER — HYDROMORPHONE BOLUS VIA INFUSION
0.5000 mg | INTRAVENOUS | Status: DC | PRN
  Filled 2011-05-03: qty 2

## 2011-05-03 NOTE — Progress Notes (Signed)
PT Cancellation Note  Treatment cancelled today due to patient receiving procedure or test. Pt taken to radiology just as PT session was beginning.  Will attempt to follow-up with pt later today.    Kyna Blahnik 05/03/2011, 6:18 PM Lissa Rowles L. Graylyn Bunney DPT [phone]
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
## 2011-05-03 NOTE — Progress Notes (Signed)
CRITICAL VALUE ALERT  Critical value received:  Potassium 2.7  Date of notification:  4-12  Time of notification:  12:14  Critical value read back:yes  Nurse who received alert:  Ninetta Lights  MD notified (1st page):  PA Rayburn   Time of first page:  12:14  MD notified (2nd page):  Time of second page:  Responding MD:  PA Rayburn  Time MD responded:  12:16
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
## 2011-05-03 NOTE — Progress Notes (Signed)
Agree 

## 2011-05-03 NOTE — Progress Notes (Signed)
Pt. Temp 100.9 during routine vitals this AM. MD notified. No new orders at this time. Patty [name], [name]

## 2011-05-03 NOTE — Progress Notes (Signed)
Cosign for [place] [facility] RN assessment, med admin, I/O, and notes

## 2011-05-03 NOTE — Progress Notes (Signed)
Pt hg 7.4 MD notified will continue to monitor

## 2011-05-03 NOTE — Progress Notes (Signed)
Patient ID: Patty [name], female   DOB: April 02, 1970, 41 y.o.   MRN: [id number]  Notified of low K+ 2.7 and K+ runs and Mg level ordered per Dr. Michaell Cowing.   Hgb also lower 7.4 down from 9.3 couple of days ago, may be source of continued tachycardia,  But not sure why dropped so significantly and where she bleed from unless is equilibration post op. CXR with low volumes only, no evidence of hemothorax and resolved pulmonary contusion  Will transfuse 1 unit and continue to follow.

## 2011-05-03 NOTE — Progress Notes (Signed)
PATIENT ID: Patty [name]  MRN: [id number]  DOB/AGE:  [birth year] / 41 y.o.  2 Days Post-Op Procedure(s) (LRB): OPEN REDUCTION INTERNAL FIXATION (ORIF) DISTAL HUMERUS FRACTURE (Left) OPEN REDUCTION INTERNAL FIXATION (ORIF) ANKLE FRACTURE (Right)    PROGRESS NOTE Subjective: Patient is alert, oriented,noNausea, no Vomiting, yes passing gas, no Bowel Movement. Taking PO well. Denies SOB, Chest or Calf Pain. Ambulating with PT (WBAT).  Objective: Vital signs in last 24 hours: Filed Vitals:   05/02/11 1337 05/02/11 1347 05/02/11 2100 05/03/11 0505  BP: 96/53 100/60 103/54 104/58  Pulse: 119  121 118  Temp: 100 F (37.8 C)  100.1 F (37.8 C) 100.9 F (38.3 C)  TempSrc: Oral  Oral Oral  Resp: 18  18 20   Height:      Weight:    91.354 kg (201 lb 6.4 oz)  SpO2: 99%  100% 98%      Intake/Output from previous day: I/O last 3 completed shifts: In: 1790 [P.O.:1620; I.V.:170] Out: 1250 [Urine:1250]   Intake/Output this shift:     LABORATORY DATA:  Basename 05/01/11 0602 04/30/11 1418  WBC 6.3 8.5  HGB 9.3* 9.6*  HCT 27.7* 29.1*  PLT 143* 172  NA -- 140  K -- 3.3*  CL -- 104  CO2 -- 26  BUN -- 6  CREATININE -- 0.58  GLUCOSE -- 112*  GLUCAP -- --  INR -- --  CALCIUM -- 8.5    Examination: Neurologically intact Neurovascular intact Sensation intact distally} Moves toes without difficulty.  Boot intact right lower extremity.  Assessment:   2 Days Post-Op Procedure(s) (LRB): OPEN REDUCTION INTERNAL FIXATION (ORIF) DISTAL HUMERUS FRACTURE (Left) OPEN REDUCTION INTERNAL FIXATION (ORIF) ANKLE FRACTURE (Right) ADDITIONAL DIAGNOSIS:  none  Plan: DISCHARGE PLAN: Home per trauma service.  F/U with Dr. Turner Daniels in 1 week for ankle xrays and suture removal.

## 2011-05-03 NOTE — Progress Notes (Signed)
Occupational Therapy Treatment Patient Details Name: Patty [name] MRN: [id number] DOB: [birth year] Today's Date: 05/03/2011  OT Assessment/Plan OT Assessment/Plan Comments on Treatment Session: This 41 yo female making progress. Will continue to benefit from acute OT with follow-up OT in CIR. OT Plan: Discharge plan remains appropriate OT Frequency: Min 2X/week Follow Up Recommendations: Inpatient Rehab Equipment Recommended: Defer to next venue OT Goals Arm Goals Arm Goal: Additional Goal #1 - Progress: Progressing toward goals  OT Treatment Precautions/Restrictions  Precautions Precautions: Fall Required Braces or Orthoses: Other Brace/Splint Other Brace/Splint: sling on LUE when up, cam walker on RLE then can be WBAT Restrictions Weight Bearing Restrictions: Yes LUE Weight Bearing: Non weight bearing RLE Weight Bearing: Weight bearing as tolerated (with cam walker)   ADL   Mobility    Exercises Other Exercises Other Exercises: Pt completed 10 reps of AROM composite digit flexion/extension--able to get full ROM with constraints of splint wrapping. Also completed 10 reps of SROM of shoulder flexion/extension and horizontal abduction/adduction with supervision. Encouraged her to do 2 more sets of these with 10 reps each today and then tomorrow and Sunday 10 reps of each 5 times a day. Offerred to write them down, she said she did not need me to.  End of Session OT - End of Session Equipment Utilized During Treatment:  (repositioned sling) Activity Tolerance: Patient tolerated treatment well Patient left: in chair;with call bell in reach (LUE propped up on pillows) General Behavior During Session: [facility] for tasks performed Cognition: [facility] for tasks performed  Evette Georges [phone] 05/03/2011, 1:03 PM

## 2011-05-03 NOTE — Progress Notes (Addendum)
Will change narcotics around Try benadryl for help Mobilize more D/W Dr. Lindie Spruce - Agree with cardiology evaluation, possible repeat ECG.  ?add metoprolol
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
## 2011-05-04 ENCOUNTER — Inpatient Hospital Stay (HOSPITAL_COMMUNITY): Payer: No Typology Code available for payment source

## 2011-05-04 LAB — BASIC METABOLIC PANEL
BUN: 4 mg/dL — ABNORMAL LOW (ref 6–23)
CO2: 29 mEq/L (ref 19–32)
Chloride: 103 mEq/L (ref 96–112)
GFR calc non Af Amer: >90 mL/min (ref >90)
Glucose, Bld: 96 mg/dL (ref 70–99)
Potassium: 3.5 mEq/L (ref 3.5–5.1)
Sodium: 140 mEq/L (ref 135–145)

## 2011-05-04 LAB — CBC
HCT: 24.5 % — ABNORMAL LOW (ref 36.0–46.0)
Hemoglobin: 8.3 g/dL — ABNORMAL LOW (ref 12.0–15.0)
MCH: 27 pg (ref 26.0–34.0)
MCH: 27.7 pg (ref 26.0–34.0)
MCHC: 32.9 g/dL (ref 30.0–36.0)
MCHC: 33.9 g/dL (ref 30.0–36.0)
MCV: 82 fL (ref 78.0–100.0)
Platelets: 153 10*3/uL (ref 150–400)
RBC: 2.78 MIL/uL — ABNORMAL LOW (ref 3.87–5.11)
RDW: 14.7 % (ref 11.5–15.5)

## 2011-05-04 LAB — PREPARE RBC (CROSSMATCH)

## 2011-05-04 MED ORDER — IOHEXOL 300 MG/ML  SOLN
100.0000 mL | Freq: Once | INTRAMUSCULAR | Status: AC | PRN
  Administered 2011-05-04: 100 mL via INTRAVENOUS

## 2011-05-04 NOTE — Progress Notes (Signed)
Up in chair A & O Lungs CTA Abdominal exam is soft, NT, +BS Sling LUE Plan: CT A/P to R/O source of blood loss Patient says she has a history of anemia Patient examined and I agree with the assessment and plan  Violeta Gelinas, MD, MPH, FACS Pager: [phone]  05/04/2011 4:47 PM

## 2011-05-04 NOTE — Progress Notes (Signed)
Physical Therapy Treatment Patient Details Name: Patty [name] MRN: [id number] DOB: September 13, 1970 Today's Date: 05/04/2011  PT Assessment/Plan  PT - Assessment/Plan Comments on Treatment Session: Tried a standard cane today.  A quad cane or Hemiwalker would work better. for stability Pt agreed that using the A device on the same side was more cumberome than if she could use her L UE. PT Plan: Discharge plan remains appropriate PT Frequency: Min 6X/week Recommendations for Other Services: Rehab consult Follow Up Recommendations: Inpatient Rehab Equipment Recommended: Defer to next venue PT Goals  Acute Rehab PT Goals PT Goal: Supine/Side to Sit - Progress: Progressing toward goal PT Goal: Sit at Edge Of Bed - Progress: Progressing toward goal PT Goal: Sit to Stand - Progress: Progressing toward goal PT Transfer Goal: Bed to Chair/Chair to Bed - Progress: Met  PT Treatment Precautions/Restrictions  Precautions Precautions: Fall Required Braces or Orthoses: Other Brace/Splint Other Brace/Splint: sling on LUE when up, cam walker on RLE then can be WBAT Restrictions Weight Bearing Restrictions: Yes LUE Weight Bearing: Non weight bearing RLE Weight Bearing: Weight bearing as tolerated Mobility (including Balance) Bed Mobility Bed Mobility: Yes Supine to Sit: 4: Min assist;HOB flat Supine to Sit Details (indicate cue type and reason): vc's for technique; min assist to assist trunk forward Sitting - Scoot to Edge of Bed: 6: Modified independent (Device/Increase time) Transfers Transfers: Yes Sit to Stand: 3: Mod assist;From bed Sit to Stand Details (indicate cue type and reason): assist to come forward over BOS; vc's to reinforce WBAT and NWB on L UE. Stand to Sit: 3: Mod assist;To chair/3-in-1 Stand to Sit Details: Pt with trouble squating to chair and was lowered to chair. Stand Pivot Transfers: 3: Mod assist Stand Pivot Transfer Details (indicate cue type and reason): vc's for  sequencing to decr weight on R LE  Ambulation/Gait Ambulation/Gait: Yes Ambulation/Gait Assistance: 3: Mod assist Ambulation/Gait Assistance Details (indicate cue type and reason): vc's for sequencing --advancing LE's and coordinating with cane Ambulation Distance (Feet): 20 Feet Assistive device: Straight cane Gait Pattern: Step-to pattern;Decreased stance time - right;Decreased step length - left;Decreased stride length Stairs: No Wheelchair Mobility Wheelchair Mobility: No  Posture/Postural Control Posture/Postural Control: No significant limitations Balance Balance Assessed: Yes Static Sitting Balance Static Sitting - Balance Support: No upper extremity supported;Feet supported Static Sitting - Level of Assistance: 5: Stand by assistance Exercise  Other Exercises Other Exercises: completed 10 reps BIL of heel slides with light resistance, SLR , LAQ seated.  Encouraged pt to continue these exercise through the weekend End of Session PT - End of Session Activity Tolerance: Patient tolerated treatment well Patient left: in bed;with call bell in reach;with family/visitor present Nurse Communication: Mobility status for transfers;Mobility status for ambulation General Behavior During Session: Patty [name] for tasks performed Cognition: Patty [name] for tasks performed  Patty [name], Patty [name] 05/04/2011, 3:53 PM4/13/2013  Patty [name], PT [phone] (pager)
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
## 2011-05-05 ENCOUNTER — Encounter (HOSPITAL_COMMUNITY): Payer: Self-pay | Admitting: *Deleted

## 2011-05-05 LAB — URINE CULTURE
Colony Count: 9000
Culture  Setup Time: 201304122051

## 2011-05-05 LAB — TYPE AND SCREEN
ABO/RH(D): O POS
Antibody Screen: NEGATIVE
Unit division: 0

## 2011-05-05 LAB — CBC
Hemoglobin: 8.7 g/dL — ABNORMAL LOW (ref 12.0–15.0)
MCH: 27.1 pg (ref 26.0–34.0)
MCHC: 32.5 g/dL (ref 30.0–36.0)
RDW: 15.1 % (ref 11.5–15.5)

## 2011-05-05 LAB — PROTIME-INR
INR: 1.04 (ref 0.00–1.49)
Prothrombin Time: 13.8 seconds (ref 11.6–15.2)

## 2011-05-05 NOTE — Progress Notes (Signed)
Physical Therapy Treatment Patient Details Name: Patty [name] MRN: [id number] DOB: 10-Apr-1970 Today's Date: 05/05/2011  PT Assessment/Plan  PT - Assessment/Plan Comments on Treatment Session: Good use of quad cane for stability with gait PT Plan: Discharge plan remains appropriate PT Frequency: Min 6X/week Follow Up Recommendations: Inpatient Rehab Equipment Recommended: Defer to next venue PT Goals  Acute Rehab PT Goals PT Goal Formulation: With patient/family Time For Goal Achievement: 2 weeks Pt will go Sit to Stand: with supervision PT Goal: Sit to Stand - Progress: Progressing toward goal Pt will go Stand to Sit: with supervision PT Goal: Stand to Sit - Progress: Progressing toward goal Pt will Ambulate: 16 - 50 feet;with supervision;with least restrictive assistive device PT Goal: Ambulate - Progress: Goal set today  PT Treatment Precautions/Restrictions  Precautions Precautions: Fall Required Braces or Orthoses: Other Brace/Splint Other Brace/Splint: sling on LUE when up, cam walker on RLE then can be WBAT Restrictions Weight Bearing Restrictions: Yes LUE Weight Bearing: Non weight bearing RLE Weight Bearing: Weight bearing as tolerated (in CAM walker) Mobility (including Balance) Transfers Sit to Stand: 3: Mod assist;From chair/3-in-1;With armrests Sit to Stand Details (indicate cue type and reason): Cues for technique, to use RUE to push from armrest Stand to Sit: 4: Min assist;With upper extremity assist;With armrests;To chair/3-in-1 Stand to Sit Details: Excellent control of descent with LEs and RUE Ambulation/Gait Ambulation/Gait Assistance: 4: Min assist Ambulation/Gait Assistance Details (indicate cue type and reason): Cues for sequence; demo cues for use of quad cane and hemiwalker Ambulation Distance (Feet): 15 Feet Assistive device: Small based quad cane;Hemi-walker Gait Pattern: Step-to pattern;Decreased stance time - right;Decreased step length -  left;Decreased stride length       End of Session PT - End of Session Equipment Utilized During Treatment: Gait belt (Sling) Activity Tolerance: Patient tolerated treatment well Patient left: in chair;with call bell in reach;with family/visitor present Nurse Communication: Mobility status for transfers;Mobility status for ambulation General Behavior During Session: [facility] for tasks performed Cognition: [facility] for tasks performed  Van Clines [facility] [place], [place] [phone]  05/05/2011, 3:19 PM
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
## 2011-05-06 ENCOUNTER — Inpatient Hospital Stay (HOSPITAL_COMMUNITY)
Admission: RE | Admit: 2011-05-06 | Discharge: 2011-05-13 | DRG: 945 | Disposition: A | Discharge status: Rehabilitation Facility | Payer: No Typology Code available for payment source | Source: Ambulatory Visit | Attending: Physical Medicine & Rehabilitation | Admitting: Physical Medicine & Rehabilitation

## 2011-05-06 ENCOUNTER — Encounter (HOSPITAL_COMMUNITY): Payer: Self-pay | Admitting: Physical Medicine and Rehabilitation

## 2011-05-06 DIAGNOSIS — G5790 Unspecified mononeuropathy of unspecified lower limb: Secondary | ICD-10-CM | POA: Diagnosis present

## 2011-05-06 DIAGNOSIS — Z5189 Encounter for other specified aftercare: Principal | ICD-10-CM

## 2011-05-06 DIAGNOSIS — Y998 Other external cause status: Secondary | ICD-10-CM

## 2011-05-06 DIAGNOSIS — M79609 Pain in unspecified limb: Secondary | ICD-10-CM | POA: Diagnosis present

## 2011-05-06 DIAGNOSIS — D649 Anemia, unspecified: Secondary | ICD-10-CM | POA: Diagnosis present

## 2011-05-06 DIAGNOSIS — S8251XA Displaced fracture of medial malleolus of right tibia, initial encounter for closed fracture: Secondary | ICD-10-CM | POA: Diagnosis not present

## 2011-05-06 DIAGNOSIS — S060X0A Concussion without loss of consciousness, initial encounter: Secondary | ICD-10-CM | POA: Diagnosis present

## 2011-05-06 DIAGNOSIS — G4700 Insomnia, unspecified: Secondary | ICD-10-CM | POA: Diagnosis present

## 2011-05-06 DIAGNOSIS — S060XAA Concussion with loss of consciousness status unknown, initial encounter: Secondary | ICD-10-CM

## 2011-05-06 DIAGNOSIS — S42409A Unspecified fracture of lower end of unspecified humerus, initial encounter for closed fracture: Secondary | ICD-10-CM | POA: Diagnosis present

## 2011-05-06 DIAGNOSIS — S2239XA Fracture of one rib, unspecified side, initial encounter for closed fracture: Secondary | ICD-10-CM

## 2011-05-06 DIAGNOSIS — S069X9A Unspecified intracranial injury with loss of consciousness of unspecified duration, initial encounter: Secondary | ICD-10-CM

## 2011-05-06 DIAGNOSIS — S8253XA Displaced fracture of medial malleolus of unspecified tibia, initial encounter for closed fracture: Secondary | ICD-10-CM

## 2011-05-06 DIAGNOSIS — R000 Tachycardia, unspecified: Secondary | ICD-10-CM | POA: Diagnosis present

## 2011-05-06 DIAGNOSIS — S060X9A Concussion with loss of consciousness of unspecified duration, initial encounter: Secondary | ICD-10-CM

## 2011-05-06 DIAGNOSIS — S42402A Unspecified fracture of lower end of left humerus, initial encounter for closed fracture: Secondary | ICD-10-CM | POA: Diagnosis present

## 2011-05-06 DIAGNOSIS — S27321A Contusion of lung, unilateral, initial encounter: Secondary | ICD-10-CM | POA: Diagnosis present

## 2011-05-06 DIAGNOSIS — S52599A Other fractures of lower end of unspecified radius, initial encounter for closed fracture: Secondary | ICD-10-CM | POA: Diagnosis present

## 2011-05-06 DIAGNOSIS — S42309A Unspecified fracture of shaft of humerus, unspecified arm, initial encounter for closed fracture: Secondary | ICD-10-CM

## 2011-05-06 LAB — CREATININE, SERUM
Creatinine, Ser: 0.49 mg/dL — ABNORMAL LOW (ref 0.50–1.10)
GFR calc Af Amer: >90 mL/min (ref >90)
GFR calc non Af Amer: >90 mL/min (ref >90)

## 2011-05-06 LAB — CBC
MCV: 83.5 fL (ref 78.0–100.0)
Platelets: 289 10*3/uL (ref 150–400)
RBC: 3.64 MIL/uL — ABNORMAL LOW (ref 3.87–5.11)
WBC: 6.2 10*3/uL (ref 4.0–10.5)

## 2011-05-06 MED ORDER — OXYCODONE HCL 5 MG PO TABS
15.0000 mg | ORAL_TABLET | ORAL | Status: DC | PRN
  Administered 2011-05-11 – 2011-05-13 (×6): 15 mg via ORAL
  Filled 2011-05-06 (×6): qty 3

## 2011-05-06 MED ORDER — OXYCODONE HCL 10 MG PO TB12
20.0000 mg | ORAL_TABLET | Freq: Two times a day (BID) | ORAL | Status: DC
  Administered 2011-05-06 – 2011-05-13 (×14): 20 mg via ORAL
  Filled 2011-05-06 (×15): qty 2

## 2011-05-06 MED ORDER — ACETAMINOPHEN 325 MG PO TABS
650.0000 mg | ORAL_TABLET | Freq: Four times a day (QID) | ORAL | Status: DC
  Administered 2011-05-06 – 2011-05-13 (×25): 650 mg via ORAL
  Filled 2011-05-06 (×27): qty 2

## 2011-05-06 MED ORDER — ONDANSETRON HCL 4 MG PO TABS
4.0000 mg | ORAL_TABLET | ORAL | Status: DC | PRN
  Administered 2011-05-06: 4 mg via ORAL
  Filled 2011-05-06: qty 1

## 2011-05-06 MED ORDER — POLYETHYLENE GLYCOL 3350 17 G PO PACK
17.0000 g | PACK | Freq: Two times a day (BID) | ORAL | Status: DC
  Administered 2011-05-06 – 2011-05-13 (×10): 17 g via ORAL
  Filled 2011-05-06 (×17): qty 1

## 2011-05-06 MED ORDER — HYDROMORPHONE HCL PF 1 MG/ML IJ SOLN: 0.5000 mg | INTRAMUSCULAR | Status: DC | PRN

## 2011-05-06 MED ORDER — FLEET ENEMA 7-19 GM/118ML RE ENEM
1.0000 | ENEMA | Freq: Every day | RECTAL | Status: DC | PRN
  Filled 2011-05-06: qty 1

## 2011-05-06 MED ORDER — POTASSIUM CHLORIDE CRYS ER 20 MEQ PO TBCR
40.0000 meq | EXTENDED_RELEASE_TABLET | Freq: Two times a day (BID) | ORAL | Status: DC
  Administered 2011-05-06: 40 meq via ORAL
  Filled 2011-05-06 (×3): qty 2

## 2011-05-06 MED ORDER — ALUM & MAG HYDROXIDE-SIMETH 200-200-20 MG/5ML PO SUSP: 30.0000 mL | ORAL | Status: DC | PRN

## 2011-05-06 MED ORDER — PROCHLORPERAZINE EDISYLATE 5 MG/ML IJ SOLN
5.0000 mg | Freq: Four times a day (QID) | INTRAMUSCULAR | Status: DC | PRN
  Filled 2011-05-06: qty 2

## 2011-05-06 MED ORDER — GUAIFENESIN-DM 100-10 MG/5ML PO SYRP: 5.0000 mL | ORAL_SOLUTION | Freq: Four times a day (QID) | ORAL | Status: DC | PRN

## 2011-05-06 MED ORDER — TRAZODONE HCL 50 MG PO TABS
25.0000 mg | ORAL_TABLET | Freq: Every evening | ORAL | Status: DC | PRN
Start: 2011-05-06 — End: 2011-05-13

## 2011-05-06 MED ORDER — DIPHENHYDRAMINE HCL 12.5 MG/5ML PO ELIX
12.5000 mg | ORAL_SOLUTION | Freq: Four times a day (QID) | ORAL | Status: DC | PRN
  Filled 2011-05-06: qty 10

## 2011-05-06 MED ORDER — ENOXAPARIN SODIUM 40 MG/0.4ML ~~LOC~~ SOLN
40.0000 mg | SUBCUTANEOUS | Status: DC
  Administered 2011-05-06 – 2011-05-12 (×7): 40 mg via SUBCUTANEOUS
  Filled 2011-05-06 (×8): qty 0.4

## 2011-05-06 MED ORDER — METHOCARBAMOL 500 MG PO TABS
500.0000 mg | ORAL_TABLET | Freq: Four times a day (QID) | ORAL | Status: DC | PRN
  Administered 2011-05-11 – 2011-05-13 (×3): 500 mg via ORAL
  Filled 2011-05-06 (×4): qty 1

## 2011-05-06 MED ORDER — HYDROMORPHONE BOLUS VIA INFUSION: 0.5000 mg | INTRAVENOUS | Status: DC | PRN

## 2011-05-06 MED ORDER — FERROUS SULFATE 325 (65 FE) MG PO TABS
325.0000 mg | ORAL_TABLET | Freq: Three times a day (TID) | ORAL | Status: DC
  Filled 2011-05-06 (×4): qty 1

## 2011-05-06 MED ORDER — PROCHLORPERAZINE 25 MG RE SUPP
12.5000 mg | Freq: Four times a day (QID) | RECTAL | Status: DC | PRN
  Filled 2011-05-06: qty 1

## 2011-05-06 MED ORDER — TRAMADOL HCL 50 MG PO TABS: 50.0000 mg | ORAL_TABLET | Freq: Four times a day (QID) | ORAL | Status: DC | PRN

## 2011-05-06 MED ORDER — BISACODYL 10 MG RE SUPP: 10.0000 mg | Freq: Every day | RECTAL | Status: DC | PRN

## 2011-05-06 MED ORDER — PROCHLORPERAZINE MALEATE 5 MG PO TABS
5.0000 mg | ORAL_TABLET | Freq: Four times a day (QID) | ORAL | Status: DC | PRN
  Filled 2011-05-06: qty 2

## 2011-05-06 MED ORDER — MAGIC MOUTHWASH
15.0000 mL | Freq: Four times a day (QID) | ORAL | Status: DC | PRN
  Filled 2011-05-06: qty 15

## 2011-05-06 NOTE — Progress Notes (Signed)
Physical Therapy Treatment Patient Details Name: Patty [name] MRN: [id number] DOB: [birth year] Today's Date: 05/06/2011  PT Assessment/Plan  PT - Assessment/Plan Comments on Treatment Session: Used the hemiwalker today as pt reports feeling more safe with this, however still needs work on safe technique with this device. Very motivated individual however having a lot of pain in her ankle with a lot of ambulation. Possibly to CIR today.  PT Plan: Discharge plan remains appropriate Follow Up Recommendations: Inpatient Rehab Equipment Recommended: Defer to next venue PT Goals  Acute Rehab PT Goals PT Goal: Supine/Side to Sit - Progress: Progressing toward goal PT Goal: Sit to Stand - Progress: Progressing toward goal PT Goal: Stand to Sit - Progress: Progressing toward goal PT Transfer Goal: Bed to Chair/Chair to Bed - Progress: Progressing toward goal PT Goal: Ambulate - Progress: Progressing toward goal  PT Treatment Precautions/Restrictions  Precautions Precautions: Fall Required Braces or Orthoses: Other Brace/Splint Other Brace/Splint: sling on LUE when up, cam walker on RLE then can be WBAT Restrictions Weight Bearing Restrictions: Yes LUE Weight Bearing: Non weight bearing RLE Weight Bearing: Weight bearing as tolerated Mobility (including Balance) Bed Mobility Supine to Sit: HOB elevated (Comment degrees) (mingaurdA; HOB elevated 45 degrees) Supine to Sit Details (indicate cue type and reason): mingaurdA for technique and to prevent putting weight through LUE Sitting - Scoot to Edge of Bed: 6: Modified independent (Device/Increase time) Transfers Sit to Stand: From bed;From elevated surface;4: Min assist Sit to Stand Details (indicate cue type and reason): minA from elevated bed; cues for use of RUE on bed and not to grab onto hemiwalker (although pt still pulling on hemi walker and needing minA to steady self)  Stand to Sit: 4: Min assist;To chair/3-in-1;With  armrests Stand to Sit Details: cues for technique and safe controlled descent to chair Ambulation/Gait Ambulation/Gait Assistance: 4: Min assist Ambulation/Gait Assistance Details (indicate cue type and reason): again cues for technique with hemiwalker (pt tending to kick hemiwalker getting it too close to her); cues for sequencing to reduce weight bearing that is causing her a lot of pain Ambulation Distance (Feet): 45 Feet Assistive device: Hemi-walker Gait Pattern: Antalgic;Step-to pattern;Decreased stance time - right  Static Sitting Balance Static Sitting - Level of Assistance: 7: Independent Exercise    End of Session PT - End of Session Equipment Utilized During Treatment: Gait belt Activity Tolerance: Patient tolerated treatment well;Patient limited by pain Patient left: in chair;with call bell in reach;with family/visitor present General Behavior During Session: [facility] for tasks performed (became a little emotional from pain after ambulating) Cognition: [facility] for tasks performed  [facility] HELEN 05/06/2011, 12:49 PM
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
## 2011-05-06 NOTE — Progress Notes (Signed)
Rehab admissions - Evaluated for possible admission.  I spoke with patient and her husband.  They are interested in inpatient rehab.  I have called [facility] on site case manager requesting inpatient rehab.  I should have an answer around 1 pm today regarding possible inpatient rehab admission.  Pager [phone]
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
## 2011-05-06 NOTE — Discharge Summary (Signed)
Okay to go to Rehab.  This patient has been seen and I agree with the findings and treatment plan.  Abdulai Blaylock O. Tashana Haberl, III, MD, FACS [phone] (pager) [phone] (direct pager) Trauma Surgeon  
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
## 2011-05-06 NOTE — Progress Notes (Signed)
Rehab admissions - I have approval and can admit to inpatient rehab today.  Pager [phone]

## 2011-05-06 NOTE — Progress Notes (Signed)
Nurse coordinator for CIR working on approval for [employer].  This patient has been seen and I agree with the findings and treatment plan.  Marta Lamas. Gae Bon, MD, FACS [phone] (pager) [phone] (direct pager) Trauma Surgeon
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
## 2011-05-06 NOTE — Progress Notes (Signed)
Patient admitted to 4149 from 5100. Reviewed with patient rehab routines, bed controls, meal times, therapy schedule, white board, butterfly wishes, safety video, safety plan, safety agreement. Pt verbalizes understanding of bed alarm in use first 24 hours and that staff need to be with her when getting up even if family in room. See CH[facility] for details of head to toe assessment.Patient aware of weight bearing restrictions to Left Arm. Will continue to monitor. Roberts-VonCannon, Aleaya Latona Elon Jester
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
## 2011-05-07 ENCOUNTER — Inpatient Hospital Stay (HOSPITAL_COMMUNITY): Payer: No Typology Code available for payment source

## 2011-05-07 DIAGNOSIS — S2239XA Fracture of one rib, unspecified side, initial encounter for closed fracture: Secondary | ICD-10-CM

## 2011-05-07 DIAGNOSIS — S8253XA Displaced fracture of medial malleolus of unspecified tibia, initial encounter for closed fracture: Secondary | ICD-10-CM

## 2011-05-07 DIAGNOSIS — Z5189 Encounter for other specified aftercare: Secondary | ICD-10-CM

## 2011-05-07 DIAGNOSIS — S060X9A Concussion with loss of consciousness of unspecified duration, initial encounter: Secondary | ICD-10-CM

## 2011-05-07 DIAGNOSIS — S42309A Unspecified fracture of shaft of humerus, unspecified arm, initial encounter for closed fracture: Secondary | ICD-10-CM

## 2011-05-07 DIAGNOSIS — S069X9A Unspecified intracranial injury with loss of consciousness of unspecified duration, initial encounter: Secondary | ICD-10-CM

## 2011-05-07 LAB — COMPREHENSIVE METABOLIC PANEL
Albumin: 3.1 g/dL — ABNORMAL LOW (ref 3.5–5.2)
Alkaline Phosphatase: 76 U/L (ref 39–117)
BUN: 6 mg/dL (ref 6–23)
Chloride: 102 mEq/L (ref 96–112)
GFR calc Af Amer: >90 mL/min (ref >90)
Glucose, Bld: 95 mg/dL (ref 70–99)
Potassium: 4.9 mEq/L (ref 3.5–5.1)
Total Bilirubin: 0.7 mg/dL (ref 0.3–1.2)

## 2011-05-07 LAB — CBC
HCT: 30.9 % — ABNORMAL LOW (ref 36.0–46.0)
Hemoglobin: 10.1 g/dL — ABNORMAL LOW (ref 12.0–15.0)
RBC: 3.66 MIL/uL — ABNORMAL LOW (ref 3.87–5.11)
WBC: 6.1 10*3/uL (ref 4.0–10.5)

## 2011-05-07 LAB — DIFFERENTIAL
Basophils Relative: 1 % (ref 0–1)
Eosinophils Absolute: 0.1 10*3/uL (ref 0.0–0.7)
Monocytes Relative: 10 % (ref 3–12)
Neutrophils Relative %: 63 % (ref 43–77)

## 2011-05-07 LAB — URINE MICROSCOPIC-ADD ON

## 2011-05-07 LAB — URINALYSIS, ROUTINE W REFLEX MICROSCOPIC
Protein, ur: NEGATIVE mg/dL
Urobilinogen, UA: 1 mg/dL (ref 0.0–1.0)

## 2011-05-07 MED ORDER — HYDROCORTISONE 1 % EX CREA
1.0000 "application " | TOPICAL_CREAM | Freq: Four times a day (QID) | CUTANEOUS | Status: DC
  Administered 2011-05-07 – 2011-05-09 (×6): 1 via TOPICAL
  Filled 2011-05-07: qty 28
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
## 2011-05-07 NOTE — Progress Notes (Signed)
Physical Therapy Note  Patient Details  Name: Patty [name] MRN: [id number] Date of Birth: May 06, 1970 Today's Date: 05/07/2011  14:15-14:45 individual therapy pt did not rate pain but stated it was not that bad. Applied ice to ankle and arm and elevated both .  Performed transfer with hemi walker minguard assist. Pt was educated on elevating rt. Leg and left arm for edema and pain control. Sit to supine supervision.   Julian Reil 05/07/2011, 3:02 PM

## 2011-05-07 NOTE — Progress Notes (Signed)
Patient ID: Patty [name], female   DOB: Jun 19, 1970, 41 y.o.   MRN: [id number] Subjective/Complaints: Review of Systems  Musculoskeletal: Positive for joint pain.  All other systems reviewed and are negative.  swelling over left foot with pain over tarsal/metatarsal area.  Slept a little better. In general pain much improved   Objective: Vital Signs: Blood pressure 109/78, pulse 97, temperature 98.1 F (36.7 C), temperature source Oral, resp. rate 18, weight 87.5 kg (192 lb 14.4 oz), last menstrual period 04/11/2011, SpO2 99.00%. No results found.  Basename 05/07/11 0530 05/06/11 1903  WBC 6.1 6.2  HGB 10.1* 9.9*  HCT 30.9* 30.4*  PLT 317 289    Basename 05/07/11 0530 05/06/11 1903  NA 140 --  K 4.9 --  CL 102 --  CO2 28 --  GLUCOSE 95 --  BUN 6 --  CREATININE 0.47* 0.49*  CALCIUM 9.4 --   CBG (last 3)  No results found for this basename: GLUCAP:3 in the last 72 hours  Wt Readings from Last 3 Encounters:  05/06/11 87.5 kg (192 lb 14.4 oz)  05/05/11 90.4 kg (199 lb 4.7 oz)  05/05/11 90.4 kg (199 lb 4.7 oz)    Physical Exam:  General appearance: alert, cooperative and no distress Head: Normocephalic, without obvious abnormality, atraumatic Eyes: conjunctivae/corneas clear. PERRL, EOM's intact. Fundi benign. Ears: normal TM's and external ear canals both ears Nose: Nares normal. Septum midline. Mucosa normal. No drainage or sinus tenderness. Throat: lips, mucosa, and tongue normal; teeth and gums normal Neck: no adenopathy, no carotid bruit, no JVD, supple, symmetrical, trachea midline and thyroid not enlarged, symmetric, no tenderness/mass/nodules Back: symmetric, no curvature. ROM normal. No CVA tenderness. Resp: clear to auscultation bilaterally Cardio: regular rate and rhythm, S1, S2 normal, no murmur, click, rub or gallop GI: soft, non-tender; bowel sounds normal; no masses,  no organomegaly Extremities: right LE with mild swelling and appropriately tender at the  MM.  Left foot tender with edema over tarsals. LUE in splint.  Pulses: 2+ and symmetric Skin: Skin color, texture, turgor normal. No rashes or lesions Neurologic: Grossly normal with no gross cognitive issues. Pain inhibition weakness. No sensory loss Incision/Wound: wounds clean and intact.   Assessment/Plan: 1. Functional deficits secondary to left distal humerus fx and right medial malleolus fx which require 3+ hours per day of interdisciplinary therapy in a comprehensive inpatient rehab setting. Physiatrist is providing close team supervision and 24 hour management of active medical problems listed below. Physiatrist and rehab team continue to assess barriers to discharge/monitor patient progress toward functional and medical goals. FIM:             FIM - Banker Devices: HOB elevated;Bed rails Bed/Chair Transfer: 4: Supine > Sit: Min A (steadying Pt. > 75%/lift 1 leg);4: Bed > Chair or W/C: Min A (steadying Pt. > 75%)  FIM - Locomotion: Wheelchair Locomotion: Wheelchair: 1: Total Assistance/staff pushes wheelchair (Pt<25%) FIM - Locomotion: Ambulation Locomotion: Ambulation Assistive Devices: Chief Operating Officer Ambulation/Gait Assistance: 4: Min assist Locomotion: Ambulation: 1: Travels less than 50 ft with minimal assistance (Pt.>75%)  Comprehension Comprehension: 6-Follows complex conversation/direction: With extra time/assistive device  Expression Expression: 5-Expresses complex 90% of the time/cues < 10% of the time  Social Interaction Social Interaction: 6-Interacts appropriately with others with medication or extra time (anti-anxiety, antidepressant).  Problem Solving Problem Solving: 5-Solves complex 90% of the time/cues < 10% of the time  Memory Memory: 6-More than reasonable amt of time 1. DVT Prophylaxis/Anticoagulation: Pharmaceutical: Lovenox  2. Pain Management: oxycontin has helped. 3. Mood: Anxiety much improved  currently. Pain control and fxnl imiprovements should help also  4. Tachycardia: Acute on chronic. Reports baseline HR @ 100. Currently increasing with activity due to deconditioning and pain. Will monitor  5. Chronic anemia: negative work up at [employer]. Baseline Hgb @ 7.0.  Current HGB is 10.1 6. H/O Lap band surgery: Eats small meals. Husband to bring her regular "foods" from home to help with appetite. Continue iron for now.  7. Left distal radius fracture: ORIF with splinting 04/10. Left message with Dr. Areatha Keas office for follow up. Current splint is intact 8. Left foot pain. Check xrays to rule out an occult fx.   LOS (Days) 1 A FACE TO FACE EVALUATION WAS PERFORMED  Patty [name] 05/07/2011, 9:40 AM

## 2011-05-07 NOTE — Progress Notes (Signed)
Pt tolerated soap suds enema, pt had a medium loose bowel movement, pt felt relief.

## 2011-05-07 NOTE — Progress Notes (Signed)
Patient information reviewed and entered into [facility] system by Paz Fuentes, RN, CRRN, PPS Coordinator.  Information including medical coding and functional independence measure will be reviewed and updated through discharge.    
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
## 2011-05-07 NOTE — Care Management Note (Signed)
Inpatient Rehabilitation Center Individual Statement of Services  Patient Name:  Patty [name]  Date:  05/07/2011  Welcome to the Inpatient Rehabilitation Center.  Our goal is to provide you with an individualized program based on your diagnosis and situation, designed to meet your specific needs.  With this comprehensive rehabilitation program, you will be expected to participate in at least 3 hours of rehabilitation therapies Monday-Friday, with modified therapy programming on the weekends.  Your rehabilitation program will include the following services:  Physical Therapy (PT), Occupational Therapy (OT), Speech Therapy (ST), 24 hour per day rehabilitation nursing, Therapeutic Recreaction (TR), Case Management (RN and Child psychotherapist), Rehabilitation Medicine, Nutrition Services and Pharmacy Services  Weekly team conferences will be held on  Tuesday  to discuss your progress.  Your RN Case Designer, television/film set will talk with you frequently to get your input and to update you on team discussions.  Team conferences with you and your family in attendance may also be held.  Expected length of stay: 7-10 days Overall anticipated outcome: Modified Independent-Min Assist  Depending on your progress and recovery, your program may change.  Your RN Case Estate agent will coordinate services and will keep you informed of any changes.  Your RN Sports coach and SW names and contact numbers are listed  below.  The following services may also be recommended but are not provided by the Inpatient Rehabilitation Center:   Driving Evaluations  Home Health Rehabiltiation Services  Outpatient Rehabilitatation [facility]  Vocational Rehabilitation   Arrangements will be made to provide these services after discharge if needed.  Arrangements include referral to agencies that provide these services.  Your insurance has been verified to be:  [facility] Your primary doctor is:    Pertinent information  will be shared with your doctor and your insurance company.  Case Manager: Melanee Spry, [facility] [phone]  Social Worker:  [place], [place] [phone]  Information discussed with and copy given to patient by: Brock Ra, 05/07/2011, 11:51 AM

## 2011-05-08 ENCOUNTER — Telehealth (INDEPENDENT_AMBULATORY_CARE_PROVIDER_SITE_OTHER): Payer: Self-pay | Admitting: General Surgery

## 2011-05-08 DIAGNOSIS — R1310 Dysphagia, unspecified: Secondary | ICD-10-CM

## 2011-05-08 DIAGNOSIS — S8253XA Displaced fracture of medial malleolus of unspecified tibia, initial encounter for closed fracture: Secondary | ICD-10-CM

## 2011-05-08 DIAGNOSIS — S069XAA Unspecified intracranial injury with loss of consciousness status unknown, initial encounter: Secondary | ICD-10-CM

## 2011-05-08 DIAGNOSIS — S42309A Unspecified fracture of shaft of humerus, unspecified arm, initial encounter for closed fracture: Secondary | ICD-10-CM

## 2011-05-08 DIAGNOSIS — S2239XA Fracture of one rib, unspecified side, initial encounter for closed fracture: Secondary | ICD-10-CM

## 2011-05-08 DIAGNOSIS — Z4651 Encounter for fitting and adjustment of gastric lap band: Secondary | ICD-10-CM

## 2011-05-08 DIAGNOSIS — R112 Nausea with vomiting, unspecified: Secondary | ICD-10-CM

## 2011-05-08 DIAGNOSIS — Z5189 Encounter for other specified aftercare: Secondary | ICD-10-CM

## 2011-05-08 DIAGNOSIS — S069X9A Unspecified intracranial injury with loss of consciousness of unspecified duration, initial encounter: Secondary | ICD-10-CM

## 2011-05-08 LAB — URINE CULTURE

## 2011-05-08 MED ORDER — MEGESTROL ACETATE 400 MG/10ML PO SUSP
400.0000 mg | Freq: Every day | ORAL | Status: AC
  Administered 2011-05-08: 400 mg via ORAL
  Filled 2011-05-08 (×3): qty 10

## 2011-05-08 NOTE — Care Management Note (Signed)
Patient ID: Patty [name], female   DOB: [birth year], 41 y.o.   MRN: [id number] Pt & husband [by phone] given team conf report:  ELOS 05/14/11  Mod I-intermittent assist.  Both state understanding & agreement w/ this plan.  Encouraged husband to come observe txs.  Will arrange family ed closer to d/c.
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
## 2011-05-08 NOTE — Progress Notes (Signed)
Physical Therapy Session Note  Patient Details  Name: Patty [name] MRN: [id number] Date of Birth: 07/16/1970  Today's Date: 05/08/2011 Time: 1020-1100 Time Calculation (min): 40 min  Short Term Goals = LTGs  Skilled Therapeutic Interventions/Progress Updates: Started 5 minutes late due to pt wishing to take a personal phone call. Gait to gym with hemiwalker with close supervision, appeared to be cumbersome for pt and discussed trying a different AD. Pt in agreement. Gait trials with wide based quad cane with close supervision and then progressed to [facility] with close supervision, cueing for gait pattern and technique with improved gait pattern noted. Pt in agreement that she was more comfortable with [facility] and therapist recommends we practice with this for gait. Continued gait training with SPC through obstacle course to negotiate obstacles and turns; practiced up/down curb step with SPC with min A overall, cueing for technique but good carryover noted with second repetition. Functional strengthening with sit to stands from lower surface without use of RUE x 5 reps with overall supervision.     Therapy Documentation Precautions:  Precautions Precautions: Fall Required Braces or Orthoses: Other Brace/Splint Restrictions Weight Bearing Restrictions: Yes LUE Weight Bearing: Non weight bearing RLE Weight Bearing: Weight bearing as tolerated     Pain:  No complaints.  See FIM for current functional status  Therapy/Group: Individual Therapy  Karolee Stamps [facility] 05/08/2011, 11:21 AM
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
## 2011-05-08 NOTE — Progress Notes (Signed)
Speech Language Pathology Daily Session Note  Patient Details  Name: Patty [name] MRN: [id number] Date of Birth: 10/07/1970  Today's Date: 05/08/2011 Time: 4098-1191 Time Calculation (min): 45 min  Short Term Goals:  SLP Short Term Goal 1 (Week 1): Patient will utilize cognitive compensatory strategies in high level alternating attention tasks as a modified independent. SLP Short Term Goal 2 (Week 1): Patient will utilize cognitive compensatory strategies with executive functions (planning, organizing, etc.) in functional high level ADL tasks (medication, money, scheduling tasks) as a modified independent.  Skilled Therapeutic Interventions: Treatment focus on complex functional problem solving and organization and alternating attention. Pt demonstrated alternating attention between two different complex problem solving tasks with Mod I. Pt required supervision question cues for organization with scheduling task and during game of "blink." Pt with increased emergent awareness throughout session reporting, "this is hard, I need this, my brain hurts."    Daily Session Precautions/Restrictions  Restrictions Weight Bearing Restrictions: Yes LUE Weight Bearing: Non weight bearing RLE Weight Bearing: Weight bearing as tolerated FIM:  Comprehension Comprehension Mode: Auditory Comprehension: 5-Understands complex 90% of the time/Cues < 10% of the time Expression Expression Mode: Verbal Expression: 6-Expresses complex ideas: With extra time/assistive device Social Interaction Social Interaction: 6-Interacts appropriately with others with medication or extra time (anti-anxiety, antidepressant). Problem Solving Problem Solving: 5-Solves complex 90% of the time/cues < 10% of the time Memory Memory: 6-More than reasonable amt of time FIM - Eating Eating Activity: 7: Complete independence:no helper Pain Pain Assessment Pain Assessment: No/denies pain  Therapy/Group: Individual  Therapy  Patty [name] 05/08/2011, 5:05 PM

## 2011-05-08 NOTE — Progress Notes (Signed)
Patient ID: Patty [name], female   DOB: Mar 11, 1970, 41 y.o.   MRN: [id number] Patient ID: Patty [name], female   DOB: 04/20/1970, 41 y.o.   MRN: [id number] Subjective/Complaints: Review of Systems  Musculoskeletal: Positive for joint pain.  All other systems reviewed and are negative.  overall she's had a good day. Her left foot is a little swelling but pain is better.  She is concerned over her appetite and the fact that she feels full.  Objective: Vital Signs: Blood pressure 110/76, pulse 115, temperature 98 F (36.7 C), temperature source Oral, resp. rate 16, weight 87.5 kg (192 lb 14.4 oz), last menstrual period 04/11/2011, SpO2 100.00%. Dg Foot 2 Views Left  05/07/2011  *RADIOLOGY REPORT*  Clinical Data: left foot swelling  LEFT FOOT - 2 VIEW  Comparison: None.  Findings: Two views of the left foot submitted.  No acute fracture or subluxation.  Tiny plantar spur of the calcaneus.  IMPRESSION: No acute fracture or subluxation.  Tiny plantar spur of the calcaneus.  Original Report Authenticated By: Natasha Mead, M.D.    Basename 05/07/11 0530 05/06/11 1903  WBC 6.1 6.2  HGB 10.1* 9.9*  HCT 30.9* 30.4*  PLT 317 289    Basename 05/07/11 0530 05/06/11 1903  NA 140 --  K 4.9 --  CL 102 --  CO2 28 --  GLUCOSE 95 --  BUN 6 --  CREATININE 0.47* 0.49*  CALCIUM 9.4 --   CBG (last 3)  No results found for this basename: GLUCAP:3 in the last 72 hours  Wt Readings from Last 3 Encounters:  05/06/11 87.5 kg (192 lb 14.4 oz)  05/05/11 90.4 kg (199 lb 4.7 oz)  05/05/11 90.4 kg (199 lb 4.7 oz)    Physical Exam:  General appearance: alert, cooperative and no distress Head: Normocephalic, without obvious abnormality, atraumatic Eyes: conjunctivae/corneas clear. PERRL, EOM's intact. Fundi benign. Ears: normal TM's and external ear canals both ears Nose: Nares normal. Septum midline. Mucosa normal. No drainage or sinus tenderness. Throat: lips, mucosa, and tongue normal; teeth and gums  normal Neck: no adenopathy, no carotid bruit, no JVD, supple, symmetrical, trachea midline and thyroid not enlarged, symmetric, no tenderness/mass/nodules Back: symmetric, no curvature. ROM normal. No CVA tenderness. Resp: clear to auscultation bilaterally Cardio: regular rate and rhythm, S1, S2 normal, no murmur, click, rub or gallop GI: soft, non-tender; bowel sounds normal; no masses,  no organomegaly Extremities: right LE with mild swelling and appropriately tender at the MM.  Left foot less tender. LUE in splint.  Pulses: 2+ and symmetric Skin: Skin color, texture, turgor normal. No rashes or lesions Neurologic: Grossly normal with no gross cognitive issues. Pain inhibition weakness. No sensory loss Incision/Wound: wounds clean and intact.   Assessment/Plan: 1. Functional deficits secondary to left distal humerus fx and right medial malleolus fx which require 3+ hours per day of interdisciplinary therapy in a comprehensive inpatient rehab setting. Physiatrist is providing close team supervision and 24 hour management of active medical problems listed below. Physiatrist and rehab team continue to assess barriers to discharge/monitor patient progress toward functional and medical goals. FIM: FIM - Bathing Bathing Steps Patient Completed: Chest;Abdomen;Front perineal area;Buttocks;Right upper leg;Left upper leg Bathing: 3: Mod-Patient completes 5-7 6f 10 parts or 50-74%  FIM - Upper Body Dressing/Undressing Upper body dressing/undressing steps patient completed: Put head through opening of pull over shirt/dress Upper body dressing/undressing: 2: Max-Patient completed 25-49% of tasks FIM - Lower Body Dressing/Undressing Lower body dressing/undressing steps patient completed: Thread/unthread right underwear leg;Thread/unthread  left underwear leg;Pull underwear up/down;Thread/unthread right pants leg;Thread/unthread left pants leg;Pull pants up/down;Don/Doff left sock;Fasten/unfasten left  shoe Lower body dressing/undressing: 4: Min-Patient completed 75 plus % of tasks  FIM - Toileting Toileting: 0: Activity did not occur  FIM - Archivist Transfers: 0-Activity did not occur  FIM - Banker Devices: Cane;Orthosis Bed/Chair Transfer: 5: Chair or W/C > Bed: Supervision (verbal cues/safety issues)  FIM - Locomotion: Wheelchair Locomotion: Wheelchair: 0: Activity did not occur (gait to/from therapy) FIM - Locomotion: Ambulation Locomotion: Ambulation Assistive Devices: IT consultant Ambulation/Gait Assistance: 4: Min assist Locomotion: Ambulation: 5: Travels 150 ft or more with supervision/safety issues  Comprehension Comprehension Mode: Auditory Comprehension: 6-Follows complex conversation/direction: With extra time/assistive device  Expression Expression Mode: Verbal Expression: 7-Expresses complex ideas: With no assist  Social Interaction Social Interaction: 7-Interacts appropriately with others - No medications needed.  Problem Solving Problem Solving: 5-Solves complex 90% of the time/cues < 10% of the time  Memory Memory: 6-More than reasonable amt of time 1. DVT Prophylaxis/Anticoagulation: Pharmaceutical: Lovenox  2. Pain Management: oxycontin has helped. 3. Mood: Anxiety much improved currently. Pain control and fxnl imiprovements should help also  4. Tachycardia: Acute on chronic. Reports baseline HR @ 100. Currently increasing with activity due to deconditioning and pain. Will monitor  5. Chronic anemia: negative work up at [employer]. Baseline Hgb @ 7.0.  Current HGB is 10.1 6. H/O Lap band surgery: Eats small meals. Husband to bring her regular "foods" from home to help with appetite. Continue iron for now. Pt requested that we contact surgery about temporarily loosening her band to allow more intake.  -will add low dose megace for appetite x 3 days. 7. Left distal radius fracture: ORIF with  splinting 04/10. Left message with Dr. Areatha Keas office for follow up. Current splint is intact 8. Left foot pain. Mild sprain? xrays negative. Conservative care   LOS (Days) 2 A FACE TO FACE EVALUATION WAS PERFORMED  Hawley Michel T 05/08/2011, 2:38 PM

## 2011-05-08 NOTE — Progress Notes (Signed)
Occupational Therapy Session Note  Patient Details  Name: Patty [name] MRN: [id number] Date of Birth: [birth year]  Today's Date: 05/08/2011 Time: 0815-0900 Time Calculation (min): 45 min  Short Term Goals = Long Term Goals  Skilled Therapeutic Interventions/Progress Updates:  Patient with minimal complaints of pain in left UE, no medication needed at this time Treatment emphasis on ADL retraining at sink level. Focused skilled intervention on bed mobility, functional mobility using hemi cane from bed -> sink for UB/LB bathing & dressing, sit/stands, overall activity tolerance/endurance, donning CAM boot to right LE and sling to left UE, and grooming tasks seated at sink in w/c. Encouraged patient to use left fingers for fine motor tasks (squeeze toothpaste onto brush and other light functional tasks). At end of session patient propelled self -> family room for next therapy session with SLP. Therapist behind patient, pushing and maneuvering prn, while patient was propelling with right UE & left LE.   Precautions:  Precautions Precautions: Fall Required Braces or Orthoses: Other Brace/Splint Restrictions Weight Bearing Restrictions: Yes LUE Weight Bearing: Non weight bearing RLE Weight Bearing: Weight bearing as tolerated  See FIM for current functional status  Therapy/Group: Individual Therapy  Shefali Ng 05/08/2011, 9:58 AM

## 2011-05-08 NOTE — Progress Notes (Addendum)
Physical Therapy Session Note  Patient Details  Name: Patty [name] MRN: [id number] Date of Birth: 09-19-1970  Today's Date: 05/08/2011 Time: 4098-1191 Time Calculation (min): 57 min  Skilled Therapeutic Interventions/Progress Updates: Gait training for general strengthening and functional mobility on unit > 150' with SPC with close supervision; good carryover of technique from earlier session noted. Simulated car transfer with supervision for SUV height with SPC. Also practiced in/out of bed from tall bed height, initially cueing needed for safe technique but progressed to modified independent. Biodex for balance training with limits of stability and weightshifts on catch game with static surface and progressed to dynamic surface; 1 UE support overall supervision. Dynamic gait with pt reciting alphabet, counting in # sequence, and naming food items to with corresponding letters during gait to increase challenge of cognitive task, intermittent steady A due to narrowing BOS, increased lateral leans, and slower cadence noted. Discussed with pt and pt verbalized understanding. Pt cleaned up items of off her bed in preparation for getting into bed later without AD for support with steady A in pt room.     Therapy Documentation Precautions:  Precautions Precautions: Fall Required Braces or Orthoses: Other Brace/Splint Restrictions Weight Bearing Restrictions: Yes LUE Weight Bearing: Non weight bearing RLE Weight Bearing: Weight bearing as tolerated    Pain:  R foot bothering a little "but it's ok."  See FIM for current functional status  Therapy/Group: Individual Therapy  Karolee Stamps [facility] 05/08/2011, 2:00 PM

## 2011-05-09 DIAGNOSIS — S2239XA Fracture of one rib, unspecified side, initial encounter for closed fracture: Secondary | ICD-10-CM

## 2011-05-09 DIAGNOSIS — S42309A Unspecified fracture of shaft of humerus, unspecified arm, initial encounter for closed fracture: Secondary | ICD-10-CM

## 2011-05-09 DIAGNOSIS — S069X9A Unspecified intracranial injury with loss of consciousness of unspecified duration, initial encounter: Secondary | ICD-10-CM

## 2011-05-09 DIAGNOSIS — Z5189 Encounter for other specified aftercare: Secondary | ICD-10-CM

## 2011-05-09 DIAGNOSIS — S8253XA Displaced fracture of medial malleolus of unspecified tibia, initial encounter for closed fracture: Secondary | ICD-10-CM

## 2011-05-09 MED ORDER — BACITRACIN-NEOMYCIN-POLYMYXIN OINTMENT TUBE
TOPICAL_OINTMENT | Freq: Four times a day (QID) | CUTANEOUS | Status: DC
  Administered 2011-05-09: 14:00:00 via TOPICAL
  Administered 2011-05-10: 1 via TOPICAL
  Administered 2011-05-10: 01:00:00 via TOPICAL
  Administered 2011-05-10: 1 via TOPICAL
  Administered 2011-05-10 – 2011-05-13 (×12): via TOPICAL
  Filled 2011-05-09: qty 15

## 2011-05-09 MED ORDER — NEOMYCIN-POLYMYXIN-PRAMOXINE 1 % EX CREA: TOPICAL_CREAM | Freq: Four times a day (QID) | CUTANEOUS | Status: DC

## 2011-05-09 NOTE — Progress Notes (Signed)
Speech Language Pathology Daily Session Note  Patient Details  Name: Patty [name] MRN: [id number] Date of Birth: [birth year]  Today's Date: 05/09/2011 Time: 4098-1191 Time Calculation (min): 45 min  Short Term Goals:  SLP Short Term Goal 1 (Week 1): Patient will utilize cognitive compensatory strategies in high level alternating attention tasks as a modified independent. SLP Short Term Goal 2 (Week 1): Patient will utilize cognitive compensatory strategies with executive functions (planning, organizing, etc.) in functional high level ADL tasks (medication, money, scheduling tasks) as a modified independent.  Skilled Therapeutic Interventions: Treatment focus on complex organization with "Blink" card game. Pt recalled rules independently from yesterday's session and completed organization and complex task with Mod I but required extra time.   Daily Session FIM:  Comprehension Comprehension Mode: Auditory Comprehension: 6-Follows complex conversation/direction: With extra time/assistive device Expression Expression Mode: Verbal Expression: 6-Expresses complex ideas: With extra time/assistive device Social Interaction Social Interaction: 6-Interacts appropriately with others with medication or extra time (anti-anxiety, antidepressant). Problem Solving Problem Solving: 5-Solves complex 90% of the time/cues < 10% of the time Memory Memory: 6-More than reasonable amt of time Pain Pain Assessment Pain Assessment: No/denies pain Pain Score: 0-No pain Faces Pain Scale: No hurt  Therapy/Group: Individual Therapy  Patty [name] 05/09/2011, 10:42 AM

## 2011-05-09 NOTE — Progress Notes (Signed)
Physical Therapy Session Note  Patient Details  Name: Patty [name] MRN: [id number] Date of Birth: 06-Jul-1970  Today's Date: 05/09/2011 Time: 1021-1058 Time Calculation (min): 37 min  Skilled Therapeutic Interventions/Progress Updates:   Gait training outside on uneven terrain: inclines and grass with cane, close supervision.  Steps outside and inside for a total of 14 with one rail, close supervision.  Bed mobility on regular bed with supervision.  Pt doing excellent.  Therapy Documentation Precautions:  Precautions Precautions: Fall Required Braces or Orthoses: Other Brace/Splint Restrictions Weight Bearing Restrictions: Yes LUE Weight Bearing: Non weight bearing RLE Weight Bearing: Weight bearing as tolerated Pain: Pain Assessment Pain Assessment: No/denies pain Pain Score: 0-No pain     See FIM for current functional status  Therapy/Group: Individual Therapy  [name] 05/09/2011, 12:31 PM

## 2011-05-09 NOTE — Progress Notes (Signed)
Occupational Therapy Session Note  Patient Details  Name: Patty [name] MRN: [id number] Date of Birth: 04-13-1970  Today's Date: 05/09/2011 Time: 1191-4782 Time Calculation (min): 45 min   Skilled Therapeutic Interventions/Progress Updates: Patient seen this am for OT bathing and dressing.  Patient now ambulating with close supervision and cueing using a single point cane.  Patient shown reacher to aide with lower body dressing.  Patient able to doff cam boot and sling without assist.  Patient able to don cam boot with min assist, and sling with demonstration and verbal cueing.  Patient stood at sink for several minutes, without loss of balance, or fatigue, weight biased to right lower extremity.     Therapy Documentation Precautions:  Precautions Precautions: Fall Required Braces or Orthoses: Other Brace/Splint Restrictions Weight Bearing Restrictions: Yes LUE Weight Bearing: Non weight bearing RLE Weight Bearing: Weight bearing as tolerated    Pain: Pain Assessment Pain Score:   3 Faces Pain Scale: Hurts a little bit  See FIM for current functional status  Therapy/Group: Individual Therapy  Collier Salina 05/09/2011, 9:17 AM
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
## 2011-05-09 NOTE — Progress Notes (Signed)
Patient ID: Patty [name], female   DOB: May 04, 1970, 41 y.o.   MRN: [id number] Subjective/Complaints: Left 4th and 5th digits are numb Review of Systems  Musculoskeletal: Positive for joint pain.  All other systems reviewed and are negative.  overall she's had a good day. Her left foot is a little swelling but pain is better.  She is concerned over her appetite and the fact that she feels full.  Objective: Vital Signs: Blood pressure 105/73, pulse 92, temperature 98.4 F (36.9 C), temperature source Oral, resp. rate 20, weight 83.1 kg (183 lb 3.2 oz), last menstrual period 04/11/2011, SpO2 100.00%. No results found.  Basename 05/07/11 0530 05/06/11 1903  WBC 6.1 6.2  HGB 10.1* 9.9*  HCT 30.9* 30.4*  PLT 317 289    Basename 05/07/11 0530 05/06/11 1903  NA 140 --  K 4.9 --  CL 102 --  CO2 28 --  GLUCOSE 95 --  BUN 6 --  CREATININE 0.47* 0.49*  CALCIUM 9.4 --   CBG (last 3)  No results found for this basename: GLUCAP:3 in the last 72 hours  Wt Readings from Last 3 Encounters:  05/08/11 83.1 kg (183 lb 3.2 oz)  05/05/11 90.4 kg (199 lb 4.7 oz)  05/05/11 90.4 kg (199 lb 4.7 oz)    Physical Exam:  General appearance: alert, cooperative and no distress Head: Normocephalic, without obvious abnormality, atraumatic Eyes: conjunctivae/corneas clear. PERRL, EOM's intact. Fundi benign. Ears: normal TM's and external ear canals both ears Nose: Nares normal. Septum midline. Mucosa normal. No drainage or sinus tenderness. Throat: lips, mucosa, and tongue normal; teeth and gums normal Neck: no adenopathy, no carotid bruit, no JVD, supple, symmetrical, trachea midline and thyroid not enlarged, symmetric, no tenderness/mass/nodules Back: symmetric, no curvature. ROM normal. No CVA tenderness. Resp: clear to auscultation bilaterally Cardio: regular rate and rhythm, S1, S2 normal, no murmur, click, rub or gallop GI: soft, non-tender; bowel sounds normal; no masses,  no  organomegaly Extremities: right LE with mild swelling and appropriately tender at the MM.  Left foot less tender. LUE in splint.  Pulses: 2+ and symmetric Skin: Skin color, texture, turgor normal. No rashes or lesions Neurologic: Grossly normal with no gross cognitive issues. Pain inhibition weakness. No sensory loss Incision/Wound: wounds clean and intact. Reduced sensation L 4th and 5th digits L hand intrinsic weakness with finger abd  Assessment/Plan: 1. Functional deficits secondary to left distal humerus fx and right medial malleolus fx which require 3+ hours per day of interdisciplinary therapy in a comprehensive inpatient rehab setting. Physiatrist is providing close team supervision and 24 hour management of active medical problems listed below. Physiatrist and rehab team continue to assess barriers to discharge/monitor patient progress toward functional and medical goals. FIM: FIM - Bathing Bathing Steps Patient Completed: Left lower leg (including foot);Right lower leg (including foot) Bathing: 5: Set-up assist to: Obtain items  FIM - Upper Body Dressing/Undressing Upper body dressing/undressing steps patient completed: Thread/unthread right sleeve of pullover shirt/dresss;Thread/unthread left sleeve of pullover shirt/dress;Put head through opening of pull over shirt/dress;Pull shirt over trunk Upper body dressing/undressing: 7: Complete Independence: No helper FIM - Lower Body Dressing/Undressing Lower body dressing/undressing steps patient completed: Thread/unthread right underwear leg;Thread/unthread left underwear leg;Pull underwear up/down;Thread/unthread right pants leg;Thread/unthread left pants leg;Pull pants up/down Lower body dressing/undressing: 5: Set-up assist to: Don/Doff TED stocking  FIM - Toileting Toileting Assistive Devices: Grab bar or rail for support Toileting: 5: Supervision: Safety issues/verbal cues  FIM - Archivist Transfers: 4-To  toilet/BSC:  Min A (steadying Pt. > 75%)  FIM - Banker Devices: Teacher, music: 6: Supine > Sit: No assist;3: Supine > Sit: Mod A (lifting assist/Pt. 50-74%/lift 2 legs;6: Sit > Supine: No assist;5: Bed > Chair or W/C: Supervision (verbal cues/safety issues);5: Chair or W/C > Bed: Supervision (verbal cues/safety issues)  FIM - Locomotion: Wheelchair Locomotion: Wheelchair: 1: Total Assistance/staff pushes wheelchair (Pt<25%) FIM - Locomotion: Ambulation Locomotion: Ambulation Assistive Devices: Emergency planning/management officer Ambulation/Gait Assistance: 5: Supervision Locomotion: Ambulation: 5: Travels 150 ft or more with supervision/safety issues  Comprehension Comprehension Mode: Auditory Comprehension: 7-Follows complex conversation/direction: With no assist  Expression Expression Mode: Verbal Expression: 6-Expresses complex ideas: With extra time/assistive device  Social Interaction Social Interaction: 6-Interacts appropriately with others with medication or extra time (anti-anxiety, antidepressant).  Problem Solving Problem Solving: 5-Solves complex 90% of the time/cues < 10% of the time  Memory Memory: 6-More than reasonable amt of time 1. DVT Prophylaxis/Anticoagulation: Pharmaceutical: Lovenox  2. Pain Management: oxycontin has helped. 3. Mood: Anxiety much improved currently. Pain control and fxnl imiprovements should help also  4. Tachycardia: Acute on chronic. Reports baseline HR @ 100. Noted to be improving with increased in activity/endurance levels.  5. Chronic anemia: negative work up at [facility]. Baseline Hgb @ 7.0.  Current HGB is 10.1 6. H/O Lap band surgery: Eats small meals. Husband to bring her regular "foods" from home to help with appetite. Fluid removed yesterday with much improvement in nausea/fullness symptoms.  -will add low dose megace for appetite x 3 days. 7. Left distal radius fracture: ORIF with splinting 04/10. Spoke  with Dr. Ronne Binning for splint with 90 degree flexion to be made by OT.  He'll follow up with patient in the next couple of days.  8. Left foot pain. Mild sprain? xrays negative. Conservative care 9. 

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
## 2011-05-09 NOTE — Progress Notes (Signed)
Physical Therapy Session Note  Patient Details  Name: Patty [name] MRN: [id number] Date of Birth: 20-Aug-1970  Today's Date: 05/09/2011 Time: 1100-1200 Time Calculation (min): 60 min   Skilled Therapeutic Interventions/Progress Updates: Gait on unit with SPC with supervision, no LOB. Gait training without AD with steady A intermittently, narrowed BOS noted cueing for posture. Nustep for functional strengthening and endurance x 10 minutes on level 5. Household gait and balance in ADL apartment to completely make the bed (no AD) overall supervision; in the kitchen taking items out of the fridge and placing dishes into the dishwasher, squatting and reaching all supervision. Dynamic balance activity on compliant surface (mat) for balloon tennis with intermittent min A progressing to supervision; using ankle, hip, and step balance strategies to reach the balloon. Dynamic gait with divided attention task for hitting balloon with racket walking forwards and backwards, initially min A progressing to supervision; increased divided attention task by adding challenge to identify an item in grocery store starting with last letter of previous item identified. By the end of session pt supervision without AD even with above mentioned task; discussed possibility of outing for Monday to practice community mobility. Pt excited about participating in this.   Pt is making excellent progress. Anticipate she will be mod I in the home without an assistive device, and use of cane for community mobility.   Therapy Documentation Precautions:  Precautions Precautions: Fall Required Braces or Orthoses: Other Brace/Splint Restrictions Weight Bearing Restrictions: Yes LUE Weight Bearing: Non weight bearing RLE Weight Bearing: Weight bearing as tolerated   Pain: Pain Assessment Pain Assessment: No/denies pain  See FIM for current functional status  Therapy/Group: Individual Therapy  Patty [name] [facility] 05/09/2011,  12:08 PM

## 2011-05-10 NOTE — Progress Notes (Signed)
Team reports that pt ready for discharge Monday, 05/13/11 after therapies. Pt and her husband are in agreement with plan.

## 2011-05-10 NOTE — Progress Notes (Signed)
2100-Patient denies of pain when asked, patient received schedule tylenol. Patient last BM 4/17, refuses the scheduled miralax tonight.

## 2011-05-10 NOTE — Progress Notes (Signed)
Patient ID: Patty [name], female   DOB: Oct 18, 1970, 41 y.o.   MRN: [id number] Subjective/Complaints: Left 4th and 5th digits are numb. Some hand pain in new splint which is currently being adjusted Review of Systems  Musculoskeletal: Positive for joint pain.  All other systems reviewed and are negative.  overall she's had a good day. Her left foot is a little swelling but pain is better.    Objective: Vital Signs: Blood pressure 102/58, pulse 96, temperature 98.2 F (36.8 C), temperature source Oral, resp. rate 19, weight 83.1 kg (183 lb 3.2 oz), last menstrual period 04/11/2011, SpO2 100.00%. No results found. No results found for this basename: WBC:2,HGB:2,HCT:2,PLT:2 in the last 72 hours No results found for this basename: NA:2,K:2,CL:2,CO2:2,GLUCOSE:2,BUN:2,CREATININE:2,CALCIUM:2 in the last 72 hours CBG (last 3)  No results found for this basename: GLUCAP:3 in the last 72 hours  Wt Readings from Last 3 Encounters:  05/08/11 83.1 kg (183 lb 3.2 oz)  05/05/11 90.4 kg (199 lb 4.7 oz)  05/05/11 90.4 kg (199 lb 4.7 oz)    Physical Exam:  General appearance: alert, cooperative and no distress Head: Normocephalic, without obvious abnormality, atraumatic Eyes: conjunctivae/corneas clear. PERRL, EOM's intact. Fundi benign. Nose: Nares normal. Septum midline. Mucosa normal. No drainage or sinus tenderness. Throat: lips, mucosa, and tongue normal; teeth and gums normal Neck: no adenopathy, no carotid bruit and supple, symmetrical, trachea midline Resp: clear to auscultation bilaterally Cardio: regular rate and rhythm, S1, S2 normal, no murmur, click, rub or gallop GI: soft, non-tender; bowel sounds normal; no masses,  no organomegaly Extremities: right LE with mild swelling and appropriately tender at the MM.  Left foot less tender. LUE in splint.  Pulses: 2+ and symmetric Skin: Skin color, texture, turgor normal. No rashes or lesions Neurologic: Grossly normal with no gross cognitive  issues. Pain inhibition weakness. No sensory loss Incision/Wound: wounds clean and intact. Reduced sensation L 4th and 5th digits L hand intrinsic weakness with finger abd  Assessment/Plan: 1. Functional deficits secondary to left distal humerus fx and right medial malleolus fx which require 3+ hours per day of interdisciplinary therapy in a comprehensive inpatient rehab setting. Physiatrist is providing close team supervision and 24 hour management of active medical problems listed below. Physiatrist and rehab team continue to assess barriers to discharge/monitor patient progress toward functional and medical goals. FIM: FIM - Bathing Bathing Steps Patient Completed: Left lower leg (including foot);Right lower leg (including foot) Bathing: 5: Set-up assist to: Obtain items  FIM - Upper Body Dressing/Undressing Upper body dressing/undressing steps patient completed: Thread/unthread right sleeve of pullover shirt/dresss;Thread/unthread left sleeve of pullover shirt/dress;Put head through opening of pull over shirt/dress;Pull shirt over trunk Upper body dressing/undressing: 7: Complete Independence: No helper FIM - Lower Body Dressing/Undressing Lower body dressing/undressing steps patient completed: Thread/unthread right underwear leg;Thread/unthread left underwear leg;Pull underwear up/down;Thread/unthread right pants leg;Thread/unthread left pants leg;Pull pants up/down Lower body dressing/undressing: 5: Set-up assist to: Don/Doff TED stocking  FIM - Toileting Toileting Assistive Devices: Grab bar or rail for support Toileting: 5: Supervision: Safety issues/verbal cues  FIM - Archivist Transfers: 4-To toilet/BSC: Min A (steadying Pt. > 75%)  FIM - Bed/Chair Transfer Bed/Chair Transfer Assistive Devices: Teacher, music: 5: Bed > Chair or W/C: Supervision (verbal cues/safety issues)  FIM - Locomotion: Wheelchair Locomotion: Wheelchair: 0: Activity did not  occur FIM - Locomotion: Ambulation Locomotion: Ambulation Assistive Devices: Emergency planning/management officer Ambulation/Gait Assistance: 5: Supervision Locomotion: Ambulation: 5: Travels 150 ft or more with supervision/safety issues  Comprehension Comprehension Mode: Auditory Comprehension: 7-Follows complex conversation/direction: With no assist  Expression Expression Mode: Verbal Expression: 6-Expresses complex ideas: With extra time/assistive device  Social Interaction Social Interaction: 6-Interacts appropriately with others with medication or extra time (anti-anxiety, antidepressant).  Problem Solving Problem Solving: 5-Solves complex 90% of the time/cues < 10% of the time  Memory Memory: 6-More than reasonable amt of time 1. DVT Prophylaxis/Anticoagulation: Pharmaceutical: Lovenox  2. Pain Management: oxycontin has helped. 3. Mood: Anxiety much improved currently. Pain control and fxnl imiprovements should help also  4. Tachycardia: Acute on chronic. Reports baseline HR @ 100. Noted to be improving with increased in activity/endurance levels.  5. Chronic anemia: negative work up at [employer]. Baseline Hgb @ 7.0.  Current HGB is 10.1 6. H/O Lap band surgery: Eats small meals. Husband to bring her regular "foods" from home to help with appetite. Fluid removed yesterday with much improvement in nausea/fullness symptoms. 7. Left distal radius fracture: ORIF with splinting 04/10. Spoke with Dr. Ronne Binning for splint with 90 degree flexion to be made by OT.  He'll follow up with patient in the next couple of days.  8. Left foot pain. Mild sprain? xrays negative. Conservative care 9. 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

## 2011-05-10 NOTE — Progress Notes (Signed)
1800 Pt. Reports left radial pain after splint applied today by OT.  Pt. Reports new onset of pain with symptoms of "numbness and pressure".  Gaylyn Rong, OT notified and made aware.  Continuing to follow.

## 2011-05-10 NOTE — Progress Notes (Signed)
Occupational Therapy Session Note  Patient Details  Name: Patty [name] MRN: [id number] Date of Birth: 15-Jul-1970  Today's Date: 05/10/2011 Time:  - 5621-3086    Short Term Goals: Week 1:     Skilled Therapeutic Interventions/Progress Updates:    Addressed visual scanning, divided attention with Game SPot it.   Ambulated to gift shop with [facility].  While in gift shop ambulated without [facility] and found 3 gifts for various events with no problem recalling the events and type of gift.  Pt. Had not LOB during session And no SOB.    Therapy Documentation Precautions:  Precautions Precautions: Fall Required Braces or Orthoses: Other Brace/Splint Restrictions Weight Bearing Restrictions: Yes LUE Weight Bearing: Non weight bearing RLE Weight Bearing: Weight bearing as tolerated    Pain:  2/10 right foot      :    See FIM for current functional status  Therapy/Group: Individual Therapy  Humberto Seals 05/10/2011, 9:43 AM

## 2011-05-10 NOTE — Progress Notes (Signed)
Occupational Therapy Splint Note  Patient Details  Name: Patty [name] MRN: [id number] Date of Birth: [birth year]  Today's Date: 05/10/2011 Time: 1030-1130 Time Calculation (min): 60 min   Skilled Therapeutic Interventions/Progress Updates: Order received for posterior elbow splint s/p ORIF for distal humerus fracture.  Patient's ace wrap, cotton, gauze removed. Right arm cleaned and dried.  Incision well approximated, except for very proximal end bleeding small amount.  RN notified, and assessed.  Allowed area to remain open to air.  Elbow splinted at approximately 90 degrees of flexion with forearm in  neutral.  Patient with report of decreased sensation along ulnar distribution (4th and 5th digits.)  May benefit to hand component for long arm splint.       Therapy Documentation Precautions:  Precautions Precautions: Fall Required Braces or Orthoses: Other Brace/Splint Restrictions Weight Bearing Restrictions: Yes LUE Weight Bearing: Non weight bearing RLE Weight Bearing: Weight bearing as tolerated    Pain: Pain Assessment Pain Assessment: No/denies pain Pain Score: 0-No pain Faces Pain Scale: No hurt PAINAD (Pain Assessment in Advanced Dementia) Breathing: normal    See FIM for current functional status  Therapy/Group: Individual Therapy  Collier Salina 05/10/2011, 12:02 PM

## 2011-05-10 NOTE — Progress Notes (Signed)
Physical Therapy Note  Patient Details  Name: Gregoria Selvy MRN: [id number] Date of Birth: 01-28-1970 Today's Date: 05/10/2011  Time: 0800-0900 60 minutes  Pt c/o pain 2/10 in R LE; RN provided medication.  Pt ambulated with SPC and without AD throughout facility with supervision.  Dynamic Gait Index with total score of 17/28 with greatest deficits in change in gait speed and stairs.  Dynamic gait training without AD including retrowalking, grapevine, and stepping over with cognitive, divided attention challenges; pt had greatest difficulty with counting backwards while performing activity.  Moderate changes in balance with addition of cognitive challenges, overall pt supervision-contact guard assist throughout.  Standing dynamic balance with Wii Dance program on firm surface and mat x10 minutes.  Pt supervision on both firm and compliant surfaces.  Discussed safety with walking cam boot.  Pt expressed understanding.  Individual therapy  Lockie Pares 05/10/2011, 9:45 AM

## 2011-05-10 NOTE — Progress Notes (Signed)
Speech Language Pathology Daily Session Note  Patient Details  Name: Patty [name] MRN: [id number] Date of Birth: Sep 29, 1970  Today's Date: 05/10/2011 Time: 7829-5621 Time Calculation (min): 45 min  Short Term Goals:  SLP Short Term Goal 1 (Week 1): Patient will utilize cognitive compensatory strategies in high level alternating attention tasks as a modified independent. SLP Short Term Goal 2 (Week 1): Patient will utilize cognitive compensatory strategies with executive functions (planning, organizing, etc.) in functional high level ADL tasks (medication, money, scheduling tasks) as a modified independent.  Skilled Therapeutic Interventions: Treatment focus on complex organization of thoughts with use of "scatagories categories" game. Pt completed task with Mod I but required extra time. Pt verbalized emergent awareness by reporting "this was hard, but I did well."    Daily Session Precautions/Restrictions  Restrictions Weight Bearing Restrictions: Yes LUE Weight Bearing: Non weight bearing RLE Weight Bearing: Weight bearing as tolerated FIM:  Comprehension Comprehension Mode: Auditory Comprehension: 6-Follows complex conversation/direction: With extra time/assistive device Expression Expression Mode: Verbal Expression: 6-Expresses complex ideas: With extra time/assistive device Social Interaction Social Interaction: 6-Interacts appropriately with others with medication or extra time (anti-anxiety, antidepressant). Problem Solving Problem Solving: 5-Solves complex 90% of the time/cues < 10% of the time Memory Memory: 6-More than reasonable amt of time General    Pain Pain Assessment Pain Assessment: No/denies pain Pain Score: 0-No pain Faces Pain Scale: No hurt PAINAD (Pain Assessment in Advanced Dementia) Breathing: normal Cognition: Orientation Level: Oriented X4;Oriented to person;Oriented to time;Oriented to place;Oriented to situation Oral/Motor: Oral Motor/Sensory  Function Overall Oral Motor/Sensory Function: Appears within functional limits for tasks assessed Motor Speech Overall Motor Speech: Appears within functional limits for tasks assessed Comprehension: Auditory Comprehension Overall Auditory Comprehension: Appears within functional limits for tasks assessed Visual Recognition/Discrimination Discrimination: Within Function Limits Reading Comprehension Reading Status: Within funtional limits Expression: Expression Primary Mode of Expression: Verbal Verbal Expression Overall Verbal Expression: Appears within functional limits for tasks assessed Written Expression Dominant Hand: Right Written Expression: Within Functional Limits  Therapy/Group: Individual Therapy  Keondria Siever 05/10/2011, 12:15 PM

## 2011-05-11 DIAGNOSIS — S069XAA Unspecified intracranial injury with loss of consciousness status unknown, initial encounter: Secondary | ICD-10-CM

## 2011-05-11 DIAGNOSIS — Z5189 Encounter for other specified aftercare: Secondary | ICD-10-CM

## 2011-05-11 DIAGNOSIS — S2239XA Fracture of one rib, unspecified side, initial encounter for closed fracture: Secondary | ICD-10-CM

## 2011-05-11 DIAGNOSIS — S8253XA Displaced fracture of medial malleolus of unspecified tibia, initial encounter for closed fracture: Secondary | ICD-10-CM

## 2011-05-11 DIAGNOSIS — S069X9A Unspecified intracranial injury with loss of consciousness of unspecified duration, initial encounter: Secondary | ICD-10-CM

## 2011-05-11 DIAGNOSIS — S42309A Unspecified fracture of shaft of humerus, unspecified arm, initial encounter for closed fracture: Secondary | ICD-10-CM

## 2011-05-11 NOTE — Progress Notes (Signed)
Physical Therapy Session Note  Patient Details  Name: Patty [name] MRN: [id number] Date of Birth: 09-Jan-1971  Today's Date: 05/11/2011 Time: 1410-1505 Time Calculation (min): 55 min  Short Term Goals: No short term goals set  Therapy Documentation Precautions:  Precautions Precautions: Fall Required Braces or Orthoses: Other Brace/Splint Restrictions Weight Bearing Restrictions: Yes LUE Weight Bearing: Non weight bearing RLE Weight Bearing: Weight bearing as tolerated General:   Vital Signs: Therapy Vitals Temp: 97.9 F (36.6 C) Temp src: Oral Pulse Rate: 90  Resp: 18  BP: 115/76 mmHg Patient Position, if appropriate: Lying Oxygen Therapy SpO2: 98 % O2 Device: None (Room air) Pain: pre-med.  No c/o pain. Skilled Therapeutic Interventions/Progress Updates:  Stride Right:  Gait training with out device progressing with distance >/= 275ft close supervision, practicing gait in various directions at smaller distances, supervision. LE strengthening in standing and seated postions, AROM including Nustep Level 5, 10 min.   See FIM for current functional status  Therapy/Group: Group Therapy   Hortencia Conradi, PTA ,05/11/2011, 4:48 PM

## 2011-05-11 NOTE — Progress Notes (Signed)
Patient ID: Jozalynn Noyce, female   DOB: April 01, 1970, 41 y.o.   MRN: [id number] Patient ID: Kassia Demarinis, female   DOB: Mar 22, 1970, 41 y.o.   MRN: [id number] Subjective/Complaints:  4/20.  Left 4th and 5th digits are numb. Some hand pain in new splint which is currently being adjusted  overall she's had a good day. Her left foot is a little swelling but pain is better. Exam. Alert offers  no complaints; chest is clear. Cardiac exam revealed a regular rhythm without tachycardia ; abdomen benign; trace edema both feet  Objective: Vital Signs: Blood pressure 111/70, pulse 92, temperature 98.1 F (36.7 C), temperature source Oral, resp. rate 18, weight 183 lb 3.2 oz (83.1 kg), last menstrual period 04/11/2011, SpO2 100.00%. No results found. No results found for this basename: WBC:2,HGB:2,HCT:2,PLT:2 in the last 72 hours No results found for this basename: NA:2,K:2,CL:2,CO2:2,GLUCOSE:2,BUN:2,CREATININE:2,CALCIUM:2 in the last 72 hours CBG (last 3)  No results found for this basename: GLUCAP:3 in the last 72 hours  Wt Readings from Last 3 Encounters:  05/08/11 183 lb 3.2 oz (83.1 kg)  05/05/11 199 lb 4.7 oz (90.4 kg)  05/05/11 199 lb 4.7 oz (90.4 kg)    Physical Exam:  General appearance: alert, cooperative and no distress Head: Normocephalic, without obvious abnormality, atraumatic Eyes: conjunctivae/corneas clear. PERRL, EOM's intact. Fundi benign. Nose: Nares normal. Septum midline. Mucosa normal. No drainage or sinus tenderness. Throat: lips, mucosa, and tongue normal; teeth and gums normal Neck: no adenopathy, no carotid bruit and supple, symmetrical, trachea midline Resp: clear to auscultation bilaterally Cardio: regular rate and rhythm, S1, S2 normal, no murmur, click, rub or gallop GI: soft, non-tender; bowel sounds normal; no masses,  no organomegaly Extremities: right LE with mild swelling and appropriately tender at the MM.  Left foot less tender. LUE in splint.  Pulses: 2+  and symmetric Skin: Skin color, texture, turgor normal. No rashes or lesions Neurologic: Grossly normal with no gross cognitive issues. Pain inhibition weakness. No sensory loss Incision/Wound: wounds clean and intact. Reduced sensation L 4th and 5th digits L hand intrinsic weakness with finger abd  Assessment/Plan: 1. Functional deficits secondary to left distal humerus fx and right medial malleolus fx which require 3+ hours per day of interdisciplinary therapy in a comprehensive inpatient rehab setting. Physiatrist is providing close team supervision and 24 hour management of active medical problems listed below. Physiatrist and rehab team continue to assess barriers to discharge/monitor patient progress toward functional and medical goals. FIM: FIM - Bathing Bathing Steps Patient Completed: Left lower leg (including foot);Right lower leg (including foot) Bathing: 5: Set-up assist to: Obtain items  FIM - Upper Body Dressing/Undressing Upper body dressing/undressing steps patient completed: Thread/unthread right sleeve of pullover shirt/dresss;Thread/unthread left sleeve of pullover shirt/dress;Put head through opening of pull over shirt/dress;Pull shirt over trunk Upper body dressing/undressing: 7: Complete Independence: No helper FIM - Lower Body Dressing/Undressing Lower body dressing/undressing steps patient completed: Thread/unthread right underwear leg;Thread/unthread left underwear leg;Pull underwear up/down;Thread/unthread right pants leg;Thread/unthread left pants leg;Pull pants up/down Lower body dressing/undressing: 5: Set-up assist to: Don/Doff TED stocking  FIM - Toileting Toileting steps completed by patient: Adjust clothing prior to toileting;Performs perineal hygiene;Adjust clothing after toileting Toileting Assistive Devices: Grab bar or rail for support Toileting: 5: Supervision: Safety issues/verbal cues  FIM - Archivist Transfers: 4-To toilet/BSC: Min A  (steadying Pt. > 75%)  FIM - Bed/Chair Transfer Bed/Chair Transfer Assistive Devices: Teacher, music: 5: Bed > Chair or W/C: Supervision (verbal cues/safety issues)  FIM - Locomotion: Wheelchair Locomotion: Wheelchair: 0: Activity did not occur FIM - Locomotion: Ambulation Locomotion: Ambulation Assistive Devices: Emergency planning/management officer Ambulation/Gait Assistance: 5: Supervision Locomotion: Ambulation: 5: Travels 150 ft or more with supervision/safety issues  Comprehension Comprehension Mode: Auditory Comprehension: 6-Follows complex conversation/direction: With extra time/assistive device  Expression Expression Mode: Verbal Expression: 6-Expresses complex ideas: With extra time/assistive device  Social Interaction Social Interaction: 6-Interacts appropriately with others with medication or extra time (anti-anxiety, antidepressant).  Problem Solving Problem Solving: 5-Solves complex 90% of the time/cues < 10% of the time  Memory Memory: 6-More than reasonable amt of time 1. DVT Prophylaxis/Anticoagulation: Pharmaceutical: Lovenox  2. Pain Management: oxycontin has helped. 3. Mood: Anxiety much improved currently. Pain control and fxnl imiprovements should help also  4. Tachycardia: Acute on chronic. Reports baseline HR @ 100. Noted to be improving with increased in activity/endurance levels.  5. Chronic anemia: negative work up at [employer]. Baseline Hgb @ 7.0.  Current HGB is 10.1 6. H/O Lap band surgery: Eats small meals. Husband to bring her regular "foods" from home to help with appetite. Fluid removed yesterday with much improvement in nausea/fullness symptoms. 7. Left distal radius fracture: ORIF with splinting 04/10. Spoke with Dr. Ronne Binning for splint with 90 degree flexion to be made by OT.  He'll follow up with patient in the next couple of days.  8. Left foot pain. Mild sprain? xrays negative. Conservative care 9. 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

## 2011-05-12 NOTE — Progress Notes (Signed)
Patty [name] is a 41 y.o. female  Subjective: Going home tomorrow. Wants to know if she is going to see hand surgeon prior to discharge.  Objective: Vital signs in last 24 hours: Temp:  [97.9 F (36.6 C)-98.3 F (36.8 C)] 98.3 F (36.8 C) (04/21 0624) Pulse Rate:  [90-103] 103  (04/21 0624) Resp:  [16-18] 16  (04/21 0624) BP: (94-115)/(61-76) 94/61 mmHg (04/21 0624) SpO2:  [98 %-99 %] 99 % (04/21 0624) Weight change:  Last BM Date: 05/10/11 (per RN report)  Intake/Output from previous day: 04/20 0701 - 04/21 0700 In: 480 [P.O.:480] Out: -  Last cbgs: CBG (last 3)  No results found for this basename: GLUCAP:3 in the last 72 hours   Physical Exam General: No apparent distress    Lungs: Normal effort. Lungs clear to auscultation, no crackles or wheezes. Cardiovascular: Regular rate and rhythm, trace edema Musculoskeletal:  Neurovascularly intact. Left UE stable in splint. Neurological: No new neurological deficits Wounds:  Clean, dry, intact. No signs of infection.   Lab Results: No results found for this or any previous visit (from the past 24 hour(s)).   Studies/Results: No results found.  Medications: I have reviewed the patient's current medications.  Assessment: 1. Functional deficits secondary to left distal humerus fx and right medial malleolus fx which require 3+ hours per day of interdisciplinary therapy in a comprehensive inpatient rehab setting.  Physiatrist is providing close team supervision and 24 hour management of active medical problems listed below.  Physiatrist and rehab team continue to assess barriers to discharge/monitor patient progress toward functional and medical goals.   Plan: 1. DVT Prophylaxis/Anticoagulation: Pharmaceutical: Lovenox  2. Pain Management: oxycontin has helped.  3. Mood: Anxiety much improved currently. Pain control and fxnl imiprovements should help also  4. Tachycardia: Acute on chronic. Reports baseline HR @ 100.  Noted to be improving with increased in activity/endurance levels.  5. Chronic anemia: negative work up at [employer]. Baseline Hgb @ 7.0. Current HGB is 10.1  6. H/O Lap band surgery: Eats small meals. Husband to bring her regular "foods" from home to help with appetite. Fluid removed yesterday with much improvement in nausea/fullness symptoms.  7. Left distal humerus fracture: ORIF with splinting 04/10. New splint is more comfortable. 8. Left foot pain. Mild sprain? xrays negative. Conservative care  9. 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  Rene Paci, MD 05/12/2011, 11:57 AM

## 2011-05-12 NOTE — Progress Notes (Signed)
Occupational Therapy Session Note  Patient Details  Name: Patty [name] MRN: [id number] Date of Birth: 12-08-1970  Today's Date: 05/12/2011 Time: 0815-0900 Time Calculation (min): 45 min  Short Term Goals: STG=LTG  Skilled Therapeutic Interventions: ADL-retraining at w/c level, sink side, with emphasis on discharge planning and patient's satisfaction with her performance of ADL and mobility.   Patient reports concern over falls relating to use of CAM boot and NWB status.   She is confident and competent with use of standard w/c for seated ADL and would benefit from device s/p d/c.   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   Patient also requested clarification on strategies to reduce paresthesias at left hand, ulnar nerve distribution (4th and 5th digits).   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   OT also advised discussion on AROM exercises recommended by provider and/or standard nerve glides.   Recommend consultation with CHT if problem persists.  Therapy Documentation Precautions:  Precautions Precautions: Fall Required Braces or Orthoses: CAM boot RLE, long arm trough w/elbow flexed LUE Restrictions Weight Bearing Restrictions: Yes LUE Weight Bearing: Non weight bearing RLE Weight Bearing: Weight bearing as tolerated  Pain: Pain Assessment Pain Assessment: 0-10 Pain Score:   2 Pain Type: Acute pain Pain Location: Arm Pain Orientation: Left Pain Descriptors: Aching Pain Frequency: Intermittent Pain Onset: Gradual Pain Intervention(s): Medication (See eMAR)  See FIM for current functional status  Therapy/Group: Individual Therapy  Georgeanne Nim 05/12/2011, 1:59 PM

## 2011-05-12 NOTE — Progress Notes (Signed)
Physical Therapy Note  Patient Details  Name: Veva Grimley MRN: [id number] Date of Birth: June 21, 1970 Today's Date: 05/12/2011  1100-1150 (50 minutes) individual Pain : 2/10 Lt UE Precautions: NWB  LT UE/WBAT RT LE with [place] Focus of treatment: Therapeutic exercises for LE strengthening/endurance; gait training with SPC Treatment: Gait 150 feet SPC SBA for safety only X 2; gait up/down 4 steps SBA with one rail/SPC on left; gait around obstacles approximately 1.5 feet apart SPC SBA; bilateral LE heel slides, SLRs, SAQs, hip abduction X 20 in supine.   Raynie Steinhaus,JIM 05/12/2011, 11:45 AM

## 2011-05-12 NOTE — Progress Notes (Signed)
Physical Therapy Note  Patient Details  Name: Patty [name] MRN: [id number] Date of Birth: 07/12/1970 Today's Date: 05/12/2011  1300-1355 (55 minutes) group Pain : 2/10 LT UE/premedicated Pt participated in PT group session focusing on gait safety/endurance. Pt ambulates with close supervision for safety on unit 200 feet on level carpet, tile and modified independent in room with SPC. Pt performed horseshoe toss in standing without assistive device and retrieved horseshoes from floor with SBA with no loss of balance,.   [name],JIM 05/12/2011, 2:07 PM

## 2011-05-13 LAB — CREATININE, SERUM: GFR calc Af Amer: >90 mL/min (ref >90)

## 2011-05-13 MED ORDER — METHOCARBAMOL 500 MG PO TABS: 500.0000 mg | ORAL_TABLET | Freq: Four times a day (QID) | ORAL | Status: AC | PRN

## 2011-05-13 MED ORDER — ACETAMINOPHEN 325 MG PO TABS: ORAL_TABLET | ORAL | Status: DC

## 2011-05-13 MED ORDER — POLYETHYLENE GLYCOL 3350 17 G PO PACK: 17.0000 g | PACK | Freq: Two times a day (BID) | ORAL | Status: AC

## 2011-05-13 MED ORDER — OXYCODONE HCL 20 MG PO TB12: ORAL_TABLET | ORAL | Status: DC

## 2011-05-13 MED ORDER — BACITRACIN-NEOMYCIN-POLYMYXIN OINTMENT TUBE: 1.0000 "application " | TOPICAL_OINTMENT | Freq: Four times a day (QID) | CUTANEOUS | Status: DC

## 2011-05-13 MED ORDER — OXYCODONE HCL 15 MG PO TABS: 15.0000 mg | ORAL_TABLET | Freq: Four times a day (QID) | ORAL | Status: AC | PRN

## 2011-05-13 NOTE — Progress Notes (Signed)
Recreational Therapy Session Note  Patient Details  Name: Patty [name] MRN: [id number] Date of Birth: [birth year] Today's Date: 05/13/2011 Time: 9629-5284 Pain: no c/o pain Skilled Therapeutic Interventions/Progress Updates: Pt participated in community reintegration/outing to [facility] ambulatory level using SPC with supervision.  Pt identified, problem solved, and negotiated obstacles, identified and utilized energy conservation techniques, ambulated on even/uneven surfaces, pushed a grocery cart using RUE.  Therapy/Group: Community Reintegration  Activity Level: Moderate:  Level of assist: Supervision  Celestino Ackerman 05/13/2011, 11:56 AM

## 2011-05-13 NOTE — Progress Notes (Signed)
Patient ID: Patty [name], female   DOB: 1970/10/26, 41 y.o.   MRN: [id number] Subjective/Complaints: Looking forward towards discharge.  Concerned about imbalance due to CAM walker. Advised getting  support shoes that would equalize height discrepancy.  Review of Systems  Musculoskeletal: Positive for joint pain. discomfort due to splint at elbow and wrist have greatly improved.  No constipation.  Appetite good.   All other systems reviewed and are negative.  Objective: Vital Signs: Blood pressure 101/66, pulse 88, temperature 98.3 F (36.8 C), temperature source Oral, resp. rate 17, weight 83.1 kg (183 lb 3.2 oz), last menstrual period 04/11/2011, SpO2 100.00%. No results found. No results found for this basename: WBC:2,HGB:2,HCT:2,PLT:2 in the last 72 hours  Basename 05/13/11 0425  NA --  K --  CL --  CO2 --  GLUCOSE --  BUN --  CREATININE 0.56  CALCIUM --   CBG (last 3)  No results found for this basename: GLUCAP:3 in the last 72 hours  Wt Readings from Last 3 Encounters:  05/08/11 83.1 kg (183 lb 3.2 oz)  05/05/11 90.4 kg (199 lb 4.7 oz)  05/05/11 90.4 kg (199 lb 4.7 oz)    Physical Exam:  General appearance: alert, cooperative and no distress Head: Normocephalic, without obvious abnormality, atraumatic Eyes: conjunctivae/corneas clear. PERRL, EOM's intact. Fundi benign. Nose: Nares normal. Septum midline. Mucosa normal. No drainage or sinus tenderness. Throat: lips, mucosa, and tongue normal; teeth and gums normal Neck: no adenopathy, no carotid bruit and supple, symmetrical, trachea midline Resp: clear to auscultation bilaterally Cardio: regular rate and rhythm, S1, S2 normal, no murmur, click, rub or gallop GI: soft, non-tender; bowel sounds normal; no masses,  no organomegaly Extremities: right LE with mild swelling and appropriately tender at the MM.  Left foot less tender. LUE in splint.  Pulses: 2+ and symmetric Skin: Skin color, texture, turgor normal. No  rashes or lesions Neurologic: Grossly normal with no gross cognitive issues. Pain inhibition weakness. No sensory loss Incision/Wound: wounds clean and intact. Reduced sensation L 4th and 5th digits L hand intrinsic weakness with finger abd  Assessment/Plan: 1. Functional deficits secondary to left distal humerus fx and right medial malleolus fx stable for d/c either this pm or in am depending when family ed completed.  Also will check if ortho will see as outpt or check prior to D/C Family education to be completed today.  Independent in room without difficulty. Confidence improved.  Feels extra padding of splint distally has made a big difference in pain/sensitivity in ulna distribution (hand). SBA for balance challenges. Independent for bathing and dressing.  FIM: FIM - Bathing Bathing Steps Patient Completed: Chest;Right Arm;Left Arm;Abdomen;Front perineal area;Buttocks;Right upper leg;Left upper leg;Right lower leg (including foot);Left lower leg (including foot) Bathing: 6: Assistive device (Comment)  FIM - Upper Body Dressing/Undressing Upper body dressing/undressing steps patient completed: Thread/unthread right bra strap;Thread/unthread left bra strap;Thread/unthread right sleeve of pullover shirt/dresss;Thread/unthread left sleeve of pullover shirt/dress;Put head through opening of pull over shirt/dress;Pull shirt over trunk Upper body dressing/undressing: 7: Complete Independence: No helper FIM - Lower Body Dressing/Undressing Lower body dressing/undressing steps patient completed: Thread/unthread right underwear leg;Thread/unthread left underwear leg;Pull underwear up/down;Thread/unthread right pants leg;Thread/unthread left pants leg;Pull pants up/down;Fasten/unfasten right shoe;Don/Doff right shoe;Don/Doff left shoe;Fasten/unfasten left shoe Lower body dressing/undressing: 6: More than reasonable amount of time (+ time to affix cam boot)  FIM - Toileting Toileting steps completed  by patient: Adjust clothing prior to toileting;Performs perineal hygiene;Adjust clothing after toileting Toileting Assistive Devices: Grab bar or  rail for support Toileting: 6: Assistive device: No helper  FIM - Diplomatic Services operational officer Devices: Bedside commode;Grab bars;Occupational hygienist Transfers: 6-Assistive device: No helper  FIM - Banker Devices: Teacher, music: 6: Assistive device: no helper;6: Supine > Sit: No assist;7: Sit > Supine: No assist;6: Bed > Chair or W/C: No assist;6: Chair or W/C > Bed: No assist  FIM - Locomotion: Wheelchair Locomotion: Wheelchair: 0: Activity did not occur FIM - Locomotion: Ambulation Locomotion: Ambulation Assistive Devices: Emergency planning/management officer Ambulation/Gait Assistance: 5: Supervision Locomotion: Ambulation: 5: Travels 150 ft or more with supervision/safety issues  Comprehension Comprehension Mode: Auditory Comprehension: 7-Follows complex conversation/direction: With no assist  Expression Expression Mode: Verbal Expression: 7-Expresses complex ideas: With no assist  Social Interaction Social Interaction: 7-Interacts appropriately with others - No medications needed.  Problem Solving Problem Solving: 7-Solves complex problems: Recognizes & self-corrects  Memory Memory: 7-Complete Independence: No helper 1. DVT Prophylaxis/Anticoagulation: Pharmaceutical: Lovenox  2. Pain Management: oxycontin has helped. 3. Mood: Anxiety much improved currently. Pain control and fxnl imiprovements should help also  4. Tachycardia: Acute on chronic. Reports baseline HR @ 100. s.  5. Chronic anemia: negative work up at [employer]. Baseline Hgb @ 7.0.  Current HGB is 10.1 6. H/O Lap band surgery:  Appetite greatly improved.  Fluid removed  with much improvement in nausea/fullness symptoms. To follow up with Dr. Johna Sheriff past discharge. 7. Left distal radius fracture: ORIF with splinting 04/10. Spoke  with Dr. Ronne Binning for splint with 90 degree flexion to be made by OT.  Unable to tolerate additional flexion at elbow.  8. Left foot pain. Mild sprain? xrays negative. Conservative care 9. 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 OT to adjust brace for comfort.    LOS (Days) 7 A FACE TO FACE EVALUATION WAS PERFORMED  Jacquelynn Cree 05/13/2011, 8:01 AM

## 2011-05-13 NOTE — Progress Notes (Addendum)
Social Work Discharge Note Discharge Note  The overall goal for the admission was met for:   Discharge location: Yes-HOME WITH FAMILY  Length of Stay: Yes-7 DAYS  Discharge activity level: Yes-SUPERVISION-MOD/I LEVEL  Home/community participation: Yes  Services provided included: MD, RD, PT, OT, SLP, RN, CM, TR, Pharmacy and SW  Financial Services: Private Insurance: [facility] and Other: MED-PAY  Follow-up services arranged: Outpatient: [facility]PT  APRIL 24 WEDNESDAY 8:45-9:45 and DME: [facility]-CANE, TUB BENCH, BSC  Comments (or additional information):  Patient/Family verbalized understanding of follow-up arrangements: Yes  Individual responsible for coordination of the follow-up plan: SELF & LARRY  Confirmed correct DME delivered: Patty [name] Patty [name]    Patty [name], Patty [name]   **Addendum - oppt was place on hold and appt cancelled until pt able to weight bear per pt and agreement and MD recommendation.  Patty [name]

## 2011-05-13 NOTE — Progress Notes (Signed)
Occupational Therapy Session Note & Discharge Summary  Patient Details  Name: Patty [name] MRN: [id number] Date of Birth: [birth year]  Today's Date: 05/13/2011  SESSION NOTE  3244-0102 - 45 Minutes 1:1 OT session focusing on ADL retraining at sink level. Upon entering room patient found seated edge of bed with no complaints of pain. Patient engaged ADL at overall modified independent level, gathering all necessary items and completing bathing and dressing in sit/stand position. Patient able to donn LUE sling and RLE CAM boot independently. Patient ambulated -> ADL apartment for tub/shower transfer onto tub transfer bench at mod I level. Patient with complaints of 2/10 pain during activity and after session, no medication needed at this time. Patient mod I in room.  ----------------------------------------------------------------------------------------------------------------------------------------------------------  DISCHARGE SUMMARY  Patient has met 12 of 12 long term goals due to improved activity tolerance, improved balance, ability to compensate for deficits, functional use of  RIGHT lower and LEFT upper extremity, improved attention, improved awareness and improved coordination.  Patient to discharge at overall Modified Independent level.  Patient states her husband and son will be able to provide the necessary supervision prn at discharge. Patient is able to donn and doff LUE sling and RLE CAM walker independently and will be able to instruct son or husband prn.   Reasons goals not met: N/A; all goals met at this time.   Recommendation:  Patient will benefit from ongoing skilled OT services in outpatient setting to continue to advance functional skills in the area of iADL and functional use of LUE.  Equipment: Tub Advertising copywriter and BSC  Reasons for discharge: treatment goals met and discharge from hospital  Patient/family agrees with progress made and goals achieved:  Yes  Precautions/Restrictions  Precautions Precautions: Fall Required Braces or Orthoses: Other Brace/Splint Other Brace/Splint: LUE sling and RLE CAM walker (both when OOB) Restrictions Weight Bearing Restrictions: Yes LUE Weight Bearing: Non weight bearing RLE Weight Bearing: Weight bearing as tolerated (with CAM walker)  Pain Pain Assessment Pain Assessment: 0-10 Pain Score:   2 Faces Pain Scale: Hurts a little bit Pain Type: Acute pain Pain Location: Arm Pain Orientation: Left  ADL - See FIM  Vision/Perception  Vision - History Baseline Vision: No visual deficits Visual History:  (same as admission) Patient Visual Report: No change from baseline Vision - Assessment Eye Alignment: Within Functional Limits Perception Perception: Within Functional Limits Praxis Praxis: Intact   Cognition - See Discharge Navigator OR FIM  Sensation Sensation Light Touch: Impaired by gross assessment (left UE (ulnar)) Stereognosis: Not tested Hot/Cold: Not tested Proprioception: Not tested Coordination Gross Motor Movements are Fluid and Coordinated: Yes Fine Motor Movements are Fluid and Coordinated: Yes  Motor - See Discharge Navigator   Mobility - See Discharge Navigator   Trunk/Postural Assessment - See Discharge Navigator   Balance- See Discharge Navigator   Extremity/Trunk Assessment RUE Assessment RUE Assessment: Within Functional Limits LUE Assessment LUE Assessment: Exceptions to [facility] LUE Strength LUE Overall Strength Comments: same as admission.   See FIM for current functional status  Yoseline Andersson 05/13/2011, 7:44 AM

## 2011-05-13 NOTE — Progress Notes (Signed)
Pt discharged via wheelchair home with husband. Delle Reining PA provided discharge instructions, both husband and patient verbalized an understanding

## 2011-05-13 NOTE — Progress Notes (Signed)
Physical Therapy Note  Patient Details  Name: Patty [name] MRN: [id number] Date of Birth: 02-13-1970 Today's Date: 05/13/2011  Time: 1000-1130 90 minutes  No c/o pain initially.  C/o LE "throbbing" at end of outing, RN aware, discussed pt elevating legs, applying ice after prolonged periods of activity.  Pt participated in outing to [place] for community re-entry training.  Pt able to complete gait in various environments, problem solve, perform van, bathroom transfers all with mod I with cane or grocery cart.  Pt performed 1 flight of stairs with 1 handrail with mod I.  Pt demos improved awareness, attention, mobility and activity tolerance and functioned at a Mod I level for all community activities.  Individual therapy   Gretel Cantu 05/13/2011, 11:43 AM

## 2011-05-13 NOTE — Progress Notes (Signed)
Physical Therapy Session Note  Patient Details  Name: Patty [name] MRN: [id number] Date of Birth: 1970-05-02  Today's Date: 05/13/2011 Time: 0865-7846 Time Calculation (min): 28 min  Short Term Goals: See d/c  Skilled Therapeutic Interventions/Progress Updates:    Adjusted cane to pt height, discussed why she did not need w/c for d/c; discussed outing from earlier today; discussed with pt and nurse R ankle  ACE wrap- nurse reports wrap can come off at night, gave pt extra stockinette for LUE and explained hand wash, air dry; pt demonstrated her exercises; discussed need for proprioceptive training in ankle once able with pt/husband for later OPPT Gait training with cane in R hand working on sudden stops and changes in direction, head turns and with divided attention and cognitive tasks- pt mod I Bed mobility I Pt/husband decline need for any further skilled education from PT at this time and are ready for d/c home Pain- denies Therapy Documentation Precautions:  Precautions Precautions: Fall Required Braces or Orthoses: Other Brace/Splint Other Brace/Splint: camwalker on R, sling on L Restrictions Weight Bearing Restrictions: Yes LUE Weight Bearing: Non weight bearing RLE Weight Bearing: Weight bearing as tolerated See FIM for current functional status  Therapy/Group: Individual Therapy  Michaelene Song 05/13/2011, 3:33 PM

## 2011-05-13 NOTE — Progress Notes (Signed)
Recreational Therapy Discharge Summary Patient Details  Name: Jillyan Plitt MRN: [id number] Date of Birth: Nov 17, 1970 Today's Date: 05/13/2011  Long term goals set: 1  Long term goals met: 1  Comments on progress toward goals: Pt has made excellent progress toward goal meeting supervision level for community pursuits.  Pt able to appropriately and safely navigate community environment ambulatory level using SPC. Pt is ready for discharge home today with husband at Mod I level  Reasons goals not met: n/a  Equipment acquired: n/a  Reasons for discharge: discharge from hospital and   Patient/family agrees with progress made and goals achieved: Yes  Blessing Zaucha 05/13/2011, 12:02 PM

## 2011-05-13 NOTE — Progress Notes (Signed)
Physical Therapy Discharge Summary  Patient Details  Name: Patty [name] MRN: [id number] Date of Birth: [birth year]  Today's Date: 05/13/2011  Patient has met 7 of 7 long term goals due to improved activity tolerance, improved balance, increased strength, decreased pain, ability to compensate for deficits, improved attention and improved awareness.  Patient to discharge at an ambulatory level Modified Independent.     Reasons goals not met: n/a  Recommendation:  Patient will benefit from ongoing skilled PT services in outpatient setting to continue to advance safe functional mobility, address ongoing impairments in balance, strength, functional mobility, and minimize fall risk.  Equipment: [facility]  Reasons for discharge: treatment goals met and discharge from hospital  Patient/family agrees with progress made and goals achieved: Yes   See FIM for current functional status  Michon Kaczmarek 05/13/2011, 1:19 PM

## 2011-05-13 NOTE — Progress Notes (Signed)
Speech Language Pathology Daily Session Note & Discharge Summary  Patient Details  Name: Nishita Isaacks MRN: [id number] Date of Birth: 1970-07-27  Today's Date: 05/13/2011 Time: 1400-1430 Time Calculation (min): 30 min  Short Term Goals: Week 1: SLP Short Term Goal 1 (Week 1): Patient will utilize cognitive compensatory strategies in high level alternating attention tasks as a modified independent. SLP Short Term Goal 2 (Week 1): Patient will utilize cognitive compensatory strategies with executive functions (planning, organizing, etc.) in functional high level ADL tasks (medication, money, scheduling tasks) as a modified independent.  Skilled Therapeutic Interventions: Session focused on education and discharge discussion; patient demonstrated divided attention with packing belongings while talking to SLP with modified independence.  Patient independently utilized schedule to inform family member when to pick her up, as well as caught error in schedule and demonstrated mental flexibility with changing plan.    Daily Session Precautions/Restrictions  Precautions Required Braces or Orthoses: Other Brace/Splint Other Brace/Splint: camwalker on R, sling on L Restrictions LUE Weight Bearing: Non weight bearing RLE Weight Bearing: Weight bearing as tolerated FIM:  Comprehension Comprehension Mode: Auditory Comprehension: 7-Follows complex conversation/direction: With no assist Expression Expression Mode: Verbal Expression: 7-Expresses complex ideas: With no assist Social Interaction Social Interaction: 7-Interacts appropriately with others - No medications needed. Problem Solving Problem Solving: 7-Solves complex problems: Recognizes & self-corrects Memory Memory: 7-Complete Independence: No helper FIM - Eating Eating Activity: 7: Complete independence:no helper General    Pain Pain Assessment Pain Assessment: No/denies pain Pain Score: 0-No pain Pain Type: Surgical pain;Acute  pain Pain Location:  (premedicated) Pain Orientation: Left Pain Descriptors: Throbbing Pain Frequency: Intermittent Pain Onset: Gradual Pain Intervention(s): Medication (See eMAR) Cognition:  Mod I with complex tasks Oral/Motor: Oral Motor/Sensory Function Overall Oral Motor/Sensory Function: Appears within functional limits for tasks assessed Motor Speech Overall Motor Speech: Appears within functional limits for tasks assessed Comprehension: Auditory Comprehension Overall Auditory Comprehension: Appears within functional limits for tasks assessed Visual Recognition/Discrimination Discrimination: Within Function Limits Reading Comprehension Reading Status: Within funtional limits Expression: Expression Primary Mode of Expression: Verbal Verbal Expression Overall Verbal Expression: Appears within functional limits for tasks assessed Written Expression Dominant Hand: Right Written Expression: Within Functional Limits  Therapy/Group: Individual Therapy   Speech Language Pathology Discharge Summary  Patient Details  Name: Shaylynne Lunt MRN: [id number] Date of Birth: Aug 17, 1970  Patient has met 2 of 2 long term goals due to functional gains in problem solving and attention.  Patient to discharge at overall Modified Independent level.   Reasons goals not met: n/a  Recommendation: none at this time; however, if cognition problem arise in the outpatient setting then pursue follow up.    Equipment: none  Reasons for discharge: treatment goals met and discharge from hospital  Patient/family agrees with progress made and goals achieved: Yes  See FIM for current functional status  Charlane Ferretti., CCC-SLP [phone]  Abas Leicht 05/13/2011, 4:31 PM

## 2011-05-14 NOTE — Discharge Instructions (Signed)
Inpatient Rehab Discharge Instructions  Calah Gershman Discharge date and time: 05/13/11   Activities/Precautions/ Functional Status: Activity: activity as tolerated in home setting Diet: regular diet Wound Care: keep wound clean and dry Functional status:  ___ No restrictions     ___ Walk up steps independently ___ 24/7 supervision/assistance   ___ Walk up steps with assistance _X__ Intermittent supervision/assistance  ___ Bathe/dress independently _X__ Walk with cane and CAM boot.   ___ Bathe/dress with assistance ___ Walk Independently    ___ Shower independently ___ Walk with assistance    ___ Shower with assistance X___ No alcohol     ___ Return to work/school ________  Special Instructions:  No weight on left arm.  Keep brace on left arm at all times.  Cleanse left foot with soap and water and pat dry.  Ace wrap or support stocking on right leg to help with swelling.   COMMUNITY REFERRALS UPON DISCHARGE:     Outpatient: PT - *******cancelled f/u until out of boot    Medical equipment/Items Ordered:CANE, TUB BENCH, BSC Agency/Supplier:[facility]     My questions have been answered and I understand these instructions. I will adhere to these goals and the provided educational materials after my discharge from the hospital.  Patient/Caregiver Signature _______________________________ Date __________  Clinician Signature _______________________________________ Date __________  Please bring this form and your medication list with you to all your follow-up doctor's appointments.

## 2011-05-15 ENCOUNTER — Ambulatory Visit: Payer: 59

## 2011-05-15 NOTE — Progress Notes (Signed)
Discharge summary # [phone]
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
## 2011-05-24 ENCOUNTER — Telehealth (INDEPENDENT_AMBULATORY_CARE_PROVIDER_SITE_OTHER): Payer: Self-pay

## 2011-05-24 NOTE — Telephone Encounter (Signed)
Called patient with new follow up appointment date & time, no answer left on voicemail (05/30/11 @ 9:30 am)

## 2011-05-30 ENCOUNTER — Encounter (INDEPENDENT_AMBULATORY_CARE_PROVIDER_SITE_OTHER): Payer: Self-pay | Admitting: General Surgery

## 2011-05-30 ENCOUNTER — Ambulatory Visit (INDEPENDENT_AMBULATORY_CARE_PROVIDER_SITE_OTHER): Payer: 59 | Admitting: General Surgery

## 2011-05-30 DIAGNOSIS — Z9884 Bariatric surgery status: Secondary | ICD-10-CM
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
## 2011-06-21 ENCOUNTER — Encounter (INDEPENDENT_AMBULATORY_CARE_PROVIDER_SITE_OTHER): Payer: 59 | Admitting: General Surgery

## 2011-07-04 ENCOUNTER — Ambulatory Visit (INDEPENDENT_AMBULATORY_CARE_PROVIDER_SITE_OTHER): Payer: 59 | Admitting: Physician Assistant

## 2011-07-04 ENCOUNTER — Encounter (INDEPENDENT_AMBULATORY_CARE_PROVIDER_SITE_OTHER): Payer: Self-pay

## 2011-07-04 VITALS — BP 122/76 | Ht 68.0 in | Wt 207.6 lb

## 2011-07-04 DIAGNOSIS — Z4651 Encounter for fitting and adjustment of gastric lap band: Secondary | ICD-10-CM
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
## 2011-07-04 NOTE — Patient Instructions (Signed)
Take clear liquids tonight. Thin protein shakes are ok to start tomorrow morning. Slowly advance your diet thereafter. Call us if you have persistent vomiting or regurgitation, night cough or reflux symptoms. Return as scheduled or sooner if you notice no changes in hunger/portion sizes.  

## 2011-07-18 ENCOUNTER — Ambulatory Visit (INDEPENDENT_AMBULATORY_CARE_PROVIDER_SITE_OTHER): Payer: 59 | Admitting: General Surgery

## 2011-07-18 ENCOUNTER — Encounter (INDEPENDENT_AMBULATORY_CARE_PROVIDER_SITE_OTHER): Payer: Self-pay | Admitting: General Surgery

## 2011-07-18 VITALS — BP 120/82 | HR 72 | Temp 97.6°F | Resp 18 | Ht 68.0 in | Wt 214.4 lb

## 2011-07-18 DIAGNOSIS — Z9884 Bariatric surgery status: Secondary | ICD-10-CM
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
## 2011-07-19 ENCOUNTER — Telehealth (INDEPENDENT_AMBULATORY_CARE_PROVIDER_SITE_OTHER): Payer: Self-pay

## 2011-07-19 NOTE — Telephone Encounter (Signed)
Patient given her 6 week lap band recheck appointment for 08/12/11 @ 4:20 pm w/Dr. Johna Sheriff.

## 2011-07-19 NOTE — Telephone Encounter (Signed)
Message copied by Maryan Puls on Fri Jul 19, 2011  6:08 PM ------      Message from: Maria Stein, [place]      Created: Thu Jul 18, 2011 12:46 PM      Contact: [phone]       Pt need 4wk appt for Lapband with Dr Johna Sheriff. Please call pt with appt.

## 2011-08-12 ENCOUNTER — Encounter (INDEPENDENT_AMBULATORY_CARE_PROVIDER_SITE_OTHER): Payer: 59 | Admitting: General Surgery

## 2012-07-14 ENCOUNTER — Other Ambulatory Visit: Payer: Self-pay

## 2012-07-14 DIAGNOSIS — Z1231 Encounter for screening mammogram for malignant neoplasm of breast: Secondary | ICD-10-CM

## 2012-07-27 ENCOUNTER — Ambulatory Visit
Admission: RE | Admit: 2012-07-27 | Discharge: 2012-07-27 | Disposition: A | Discharge status: Home / Self Care | Payer: 59 | Source: Ambulatory Visit

## 2012-07-27 DIAGNOSIS — Z1231 Encounter for screening mammogram for malignant neoplasm of breast: Secondary | ICD-10-CM

## 2012-07-28 ENCOUNTER — Other Ambulatory Visit: Payer: Self-pay | Admitting: Obstetrics and Gynecology

## 2012-07-28 DIAGNOSIS — R928 Other abnormal and inconclusive findings on diagnostic imaging of breast: Secondary | ICD-10-CM

## 2012-08-07 ENCOUNTER — Ambulatory Visit
Admission: RE | Admit: 2012-08-07 | Discharge: 2012-08-07 | Disposition: A | Discharge status: Home / Self Care | Payer: 59 | Source: Ambulatory Visit | Attending: Obstetrics and Gynecology | Admitting: Obstetrics and Gynecology

## 2012-08-07 DIAGNOSIS — R928 Other abnormal and inconclusive findings on diagnostic imaging of breast: Secondary | ICD-10-CM

## 2013-03-18 ENCOUNTER — Encounter (INDEPENDENT_AMBULATORY_CARE_PROVIDER_SITE_OTHER): Payer: 59

## 2013-04-02 ENCOUNTER — Encounter (INDEPENDENT_AMBULATORY_CARE_PROVIDER_SITE_OTHER): Payer: Self-pay | Admitting: General Surgery

## 2013-04-02 ENCOUNTER — Ambulatory Visit (INDEPENDENT_AMBULATORY_CARE_PROVIDER_SITE_OTHER): Payer: 59 | Admitting: General Surgery

## 2013-04-02 VITALS — BP 126/78 | HR 75 | Temp 97.8°F | Resp 16 | Ht 68.0 in | Wt 202.0 lb

## 2013-04-02 DIAGNOSIS — Z4651 Encounter for fitting and adjustment of gastric lap band: Secondary | ICD-10-CM
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
## 2013-04-02 NOTE — Patient Instructions (Signed)
Liquids only today then begin solid foods very cautious

## 2013-04-07 ENCOUNTER — Other Ambulatory Visit: Payer: Self-pay

## 2013-04-07 DIAGNOSIS — Z1231 Encounter for screening mammogram for malignant neoplasm of breast: Secondary | ICD-10-CM

## 2013-08-02 ENCOUNTER — Ambulatory Visit: Payer: 59

## 2013-08-09 ENCOUNTER — Ambulatory Visit
Admission: RE | Admit: 2013-08-09 | Discharge: 2013-08-09 | Disposition: A | Discharge status: Home / Self Care | Payer: 59 | Source: Ambulatory Visit

## 2013-08-09 DIAGNOSIS — Z1231 Encounter for screening mammogram for malignant neoplasm of breast: Secondary | ICD-10-CM

## 2013-10-19 ENCOUNTER — Other Ambulatory Visit: Payer: Self-pay | Admitting: Otolaryngology

## 2013-10-19 DIAGNOSIS — D449 Neoplasm of uncertain behavior of unspecified endocrine gland: Secondary | ICD-10-CM
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
## 2014-01-22 IMAGING — CR DG FOOT 2V*L*
2 series · 2 of 2 positions shown · non-contrast
Comparison: None.

CLINICAL DATA: left foot swelling

LEFT FOOT - 2 VIEW

[x foot ap left]
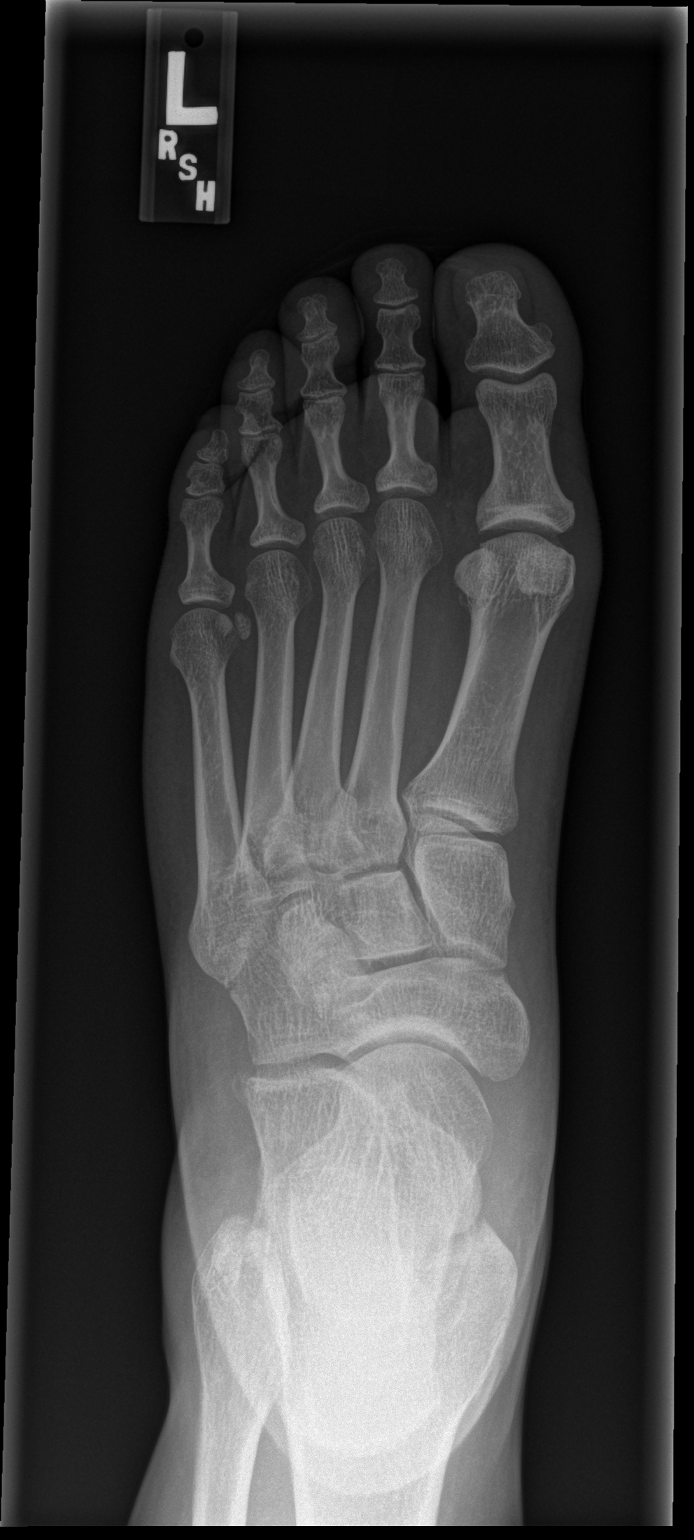

[x foot lat left]
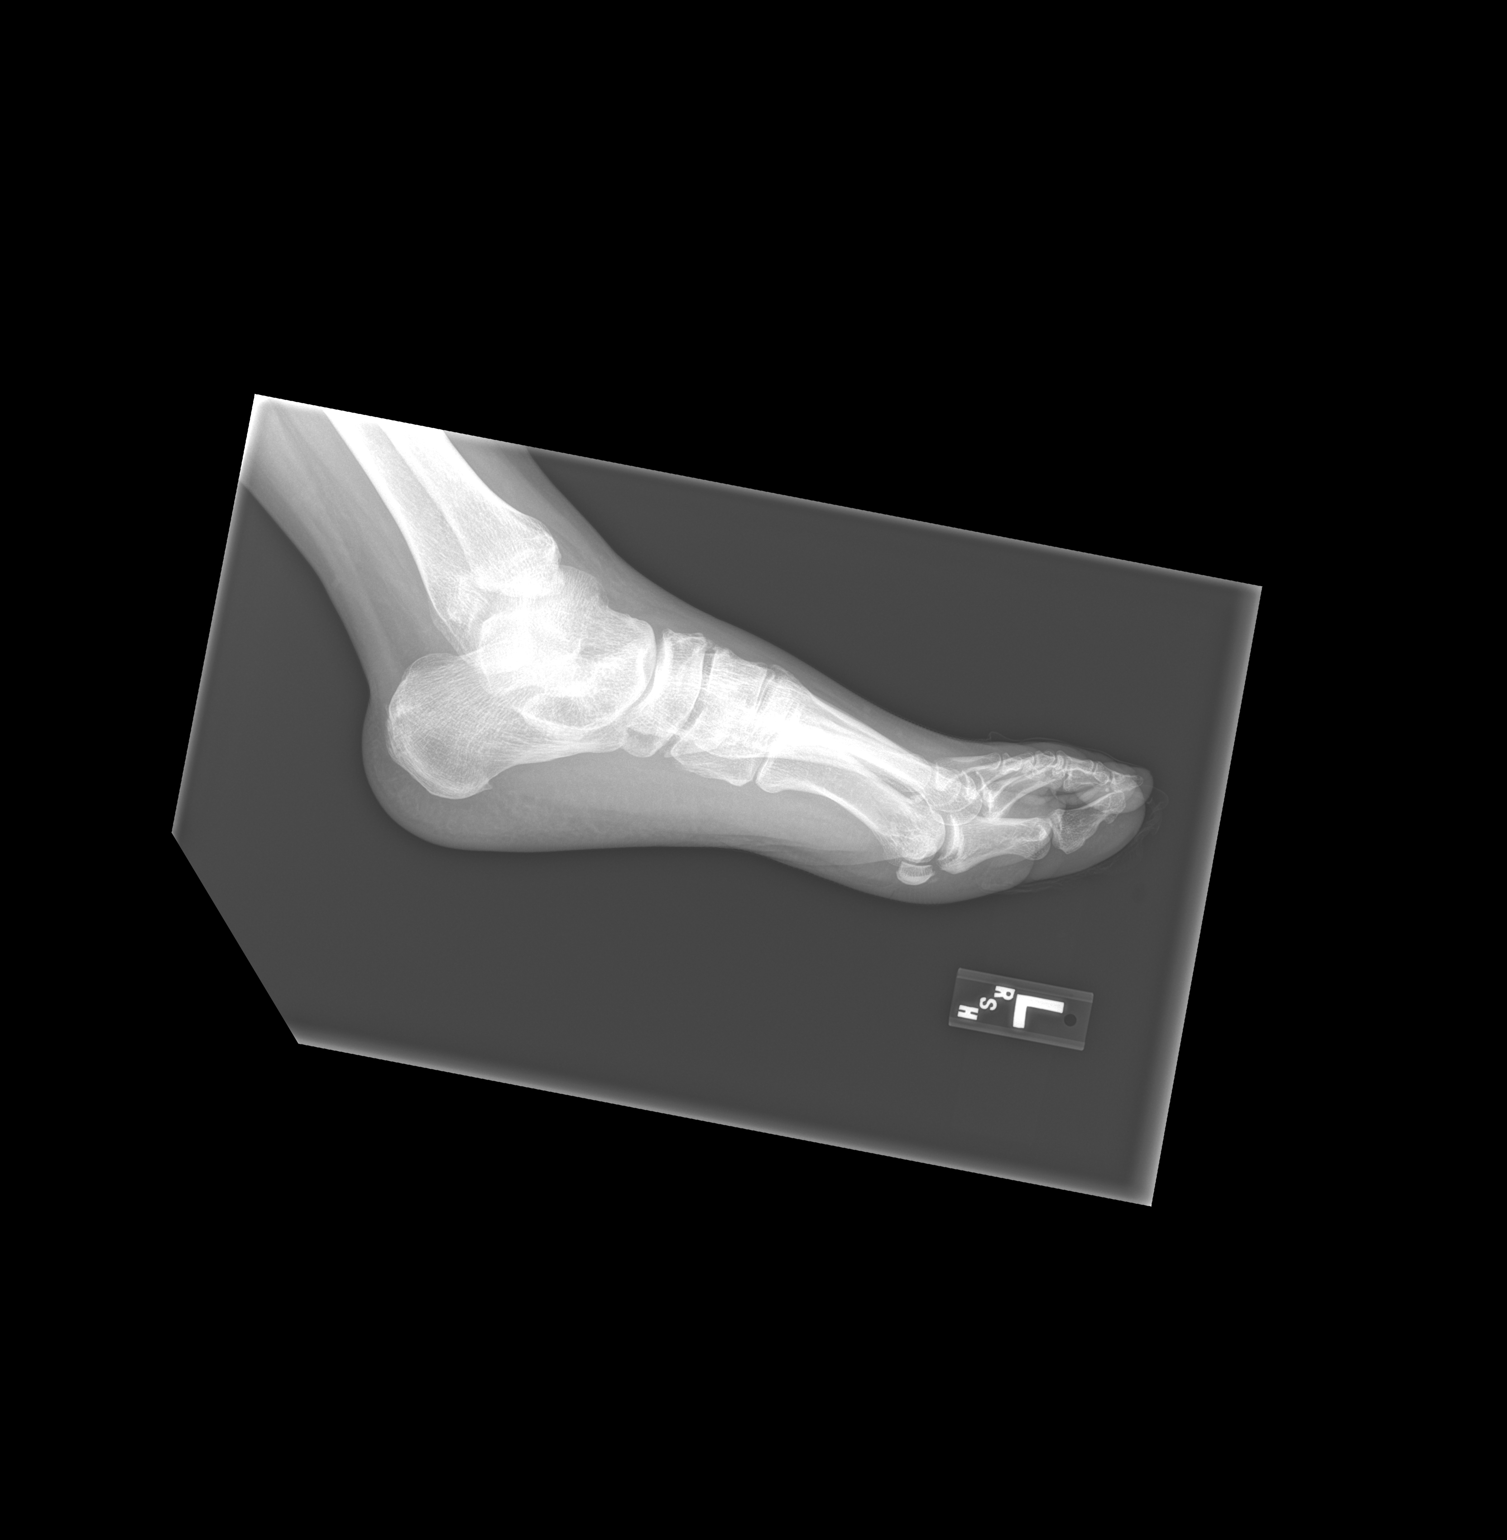

[2 of 2 positions shown; findings below may reference images not displayed]

FINDINGS: Two views of the left foot submitted.  No acute fracture
or subluxation.  Tiny plantar spur of the calcaneus.
IMPRESSION: No acute fracture or subluxation.  Tiny plantar spur of the
calcaneus.

## 2015-12-07 ENCOUNTER — Encounter (HOSPITAL_COMMUNITY): Payer: Self-pay

## 2016-04-08 ENCOUNTER — Other Ambulatory Visit: Payer: Self-pay | Admitting: General Surgery

## 2016-04-08 DIAGNOSIS — R1011 Right upper quadrant pain: Secondary | ICD-10-CM

## 2016-04-15 ENCOUNTER — Ambulatory Visit
Admission: RE | Admit: 2016-04-15 | Discharge: 2016-04-15 | Disposition: A | Discharge status: Home / Self Care | Payer: Managed Care, Other (non HMO) | Source: Ambulatory Visit | Attending: General Surgery | Admitting: General Surgery

## 2016-04-15 DIAGNOSIS — R1011 Right upper quadrant pain: Secondary | ICD-10-CM

## 2016-07-01 ENCOUNTER — Encounter: Payer: Managed Care, Other (non HMO) | Attending: General Surgery | Admitting: Skilled Nursing Facility1

## 2016-07-01 ENCOUNTER — Encounter: Payer: Self-pay | Admitting: Skilled Nursing Facility1

## 2016-07-01 DIAGNOSIS — Z713 Dietary counseling and surveillance: Secondary | ICD-10-CM | POA: Insufficient documentation

## 2016-07-01 DIAGNOSIS — Z9884 Bariatric surgery status: Secondary | ICD-10-CM | POA: Insufficient documentation

## 2016-07-01 DIAGNOSIS — E669 Obesity, unspecified: Secondary | ICD-10-CM

## 2016-07-01 DIAGNOSIS — Z6835 Body mass index (BMI) 35.0-35.9, adult: Secondary | ICD-10-CM | POA: Insufficient documentation

## 2016-07-01 NOTE — Patient Instructions (Addendum)
-  15 grams of protein or more and 5 grams of carbohydrate or less  -Try drinking your EAS shake with your cookies   -Low fat cheese is good

## 2016-07-01 NOTE — Progress Notes (Signed)
  Primary concerns today: Post-operative Bariatric Surgery Nutrition Management.  Pt states she is here to make better food choices. Pt states fluid may be taken out of her band for her to tolerate foods. 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 Pt states she eats cookies (nestle tollhouse) every day before she goes to bed used to eat 12 but now 4. Pt states nachos and potatoes will always agree with her. Frozen frappe every day but not in the last month but now freeze drinks from [employer]. Pt states she is severely anemic and always has been. Pt states she has a lot of energy. Pt states she knows what to do and does not care to do it. Pt states she is fine now and her labs are fine so she really does not need to make any changes. Pt states she would like to lose weight.   TANITA  BODY COMP RESULTS  07/01/2016   BMI (kg/m^2) 35.6   Fat Mass (lbs) 115.8   Fat Free Mass (lbs) 118.2   Total Body Water (lbs) 86.4     24-hr recall: cannot tolerate coarse meats, any fruit (never liked fruit anyway) causing vomiting; states she has been eating one meal a day 5-6 years B (AM):  Snk (AM): freeze drink L (1 PM): spinach and artichoke dip with chips Snk (PM): cookies  D (PM):  Snk (PM):   Fluid intake: water, slushys, sweet tea  Estimated total protein intake: unable to determine   Medications: See List Supplementation: no  Using straws: no Drinking while eating: no Having you been chewing well:no Chewing/swallowing difficulties: no Changes in vision: no Changes to mood/headaches: no Hair loss/Changes to skin/Changes to nails: no Any difficulty focusing or concentrating: no Sweating: no Dizziness/Lightheaded: no Palpitations: no Carbonated beverages: no N/V/D/C/GAS: vomiting with certain foods and then not eating anything for a day or so, having a bowel movement every other day Abdominal Pain:  reported to surgeon  Dumping syndrome: no Last Lap-Band fill: over 5 years  Recent physical activity:  no  Progress Towards Goal(s):  In progress.   Nutrition Intervention: Dietitian educated the pt on what is occurring inside of her body that she may not be aware of due to her lack of nutritionally healthy diet. Dietitian educated the pt on the need for a supplement. Dietitian educated the pt on how the body would let go of excess fat.  Goal: -15 grams of protein or more and 5 grams of carbohydrate or less -Try drinking your EAS shake with your cookies  -Low fat cheese is good

## 2018-04-01 ENCOUNTER — Ambulatory Visit (HOSPITAL_COMMUNITY)
Admission: EM | Admit: 2018-04-01 | Discharge: 2018-04-01 | Disposition: A | Discharge status: Home / Self Care | Payer: 59 | Attending: Family Medicine | Admitting: Family Medicine

## 2018-04-01 ENCOUNTER — Encounter (HOSPITAL_COMMUNITY): Payer: Self-pay

## 2018-04-01 DIAGNOSIS — D638 Anemia in other chronic diseases classified elsewhere: Secondary | ICD-10-CM

## 2018-04-01 DIAGNOSIS — R7989 Other specified abnormal findings of blood chemistry: Secondary | ICD-10-CM

## 2018-04-01 DIAGNOSIS — R945 Abnormal results of liver function studies: Secondary | ICD-10-CM | POA: Diagnosis not present
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
## 2018-04-01 NOTE — ED Triage Notes (Signed)
Pt presents to follow up with provider about test results done at [facility] that are abnormal.

## 2018-04-01 NOTE — Discharge Instructions (Signed)
Try stopping the Humira for a month and rechecking the labs.

## 2018-05-11 ENCOUNTER — Ambulatory Visit: Payer: 59 | Admitting: Family Medicine

## 2019-01-31 IMAGING — US US ABDOMEN COMPLETE
1 series · 14 of 25 positions shown · non-contrast
Comparison: 05/04/2011 CT.

CLINICAL DATA: 46-year-old female with right-sided generalized
abdominal pain for 6 months. Initial encounter.

EXAM:
ABDOMEN ULTRASOUND COMPLETE

[Series 1: us abdomen complete · 0.22mm/px · 14 of 85 slices shown]
[im 1/85]
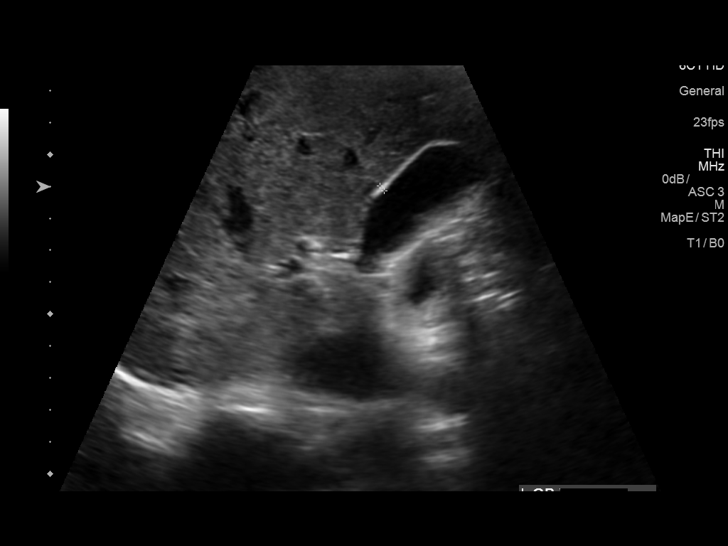
[im 8/85]
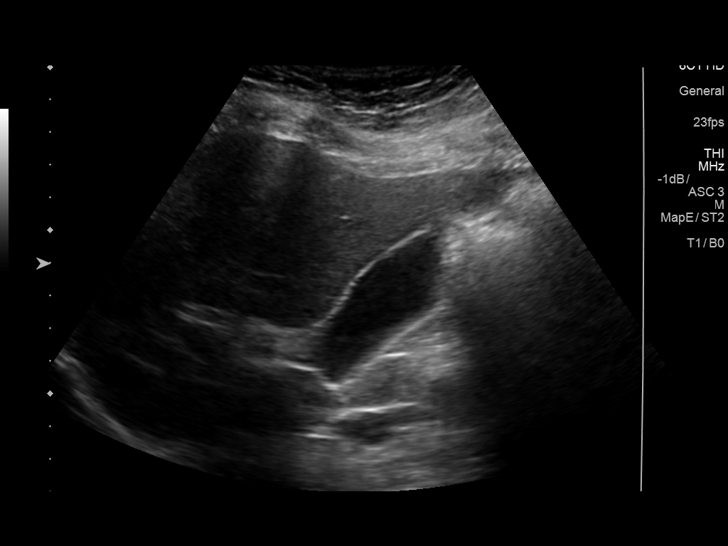
[im 15/85]
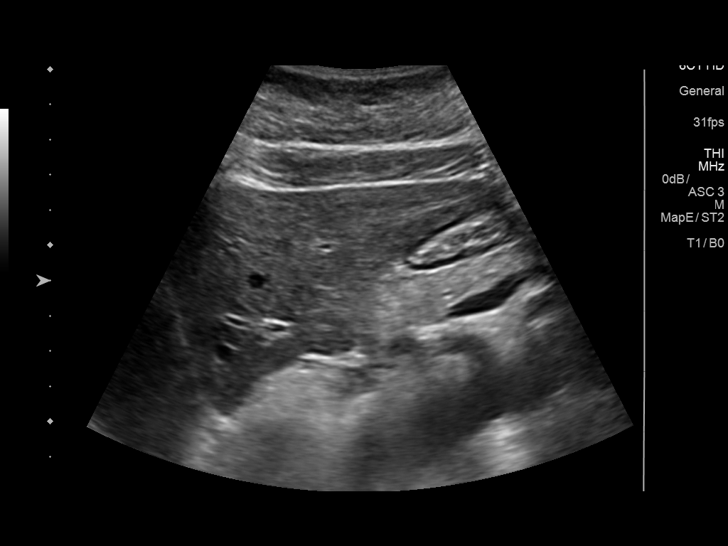
[im 22/85]
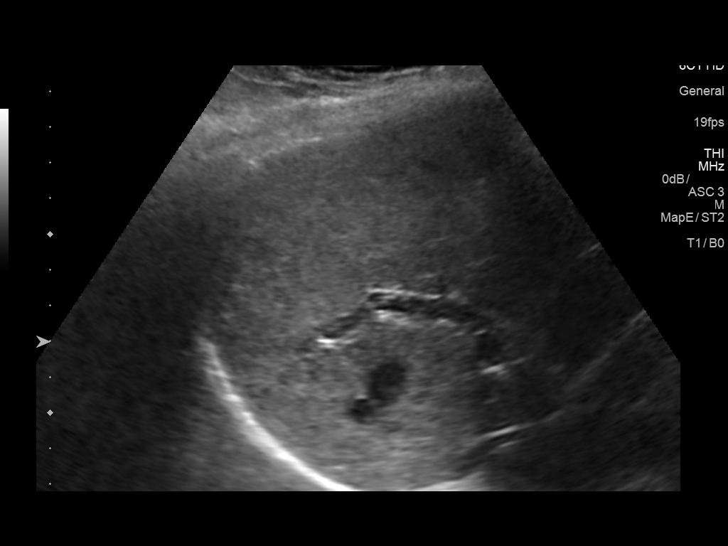
[im 29/85]
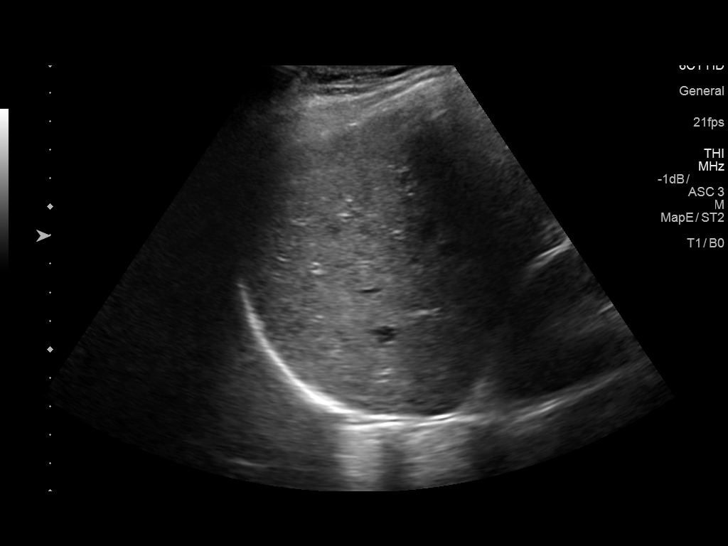
[im 32/85]
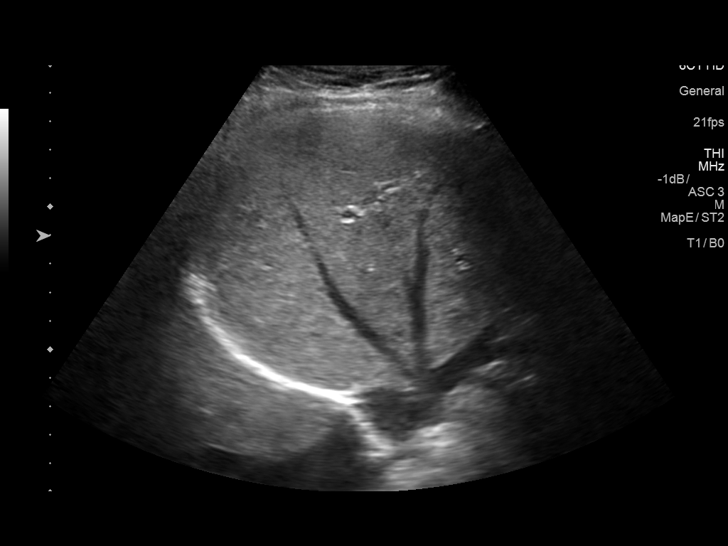
[im 39/85]
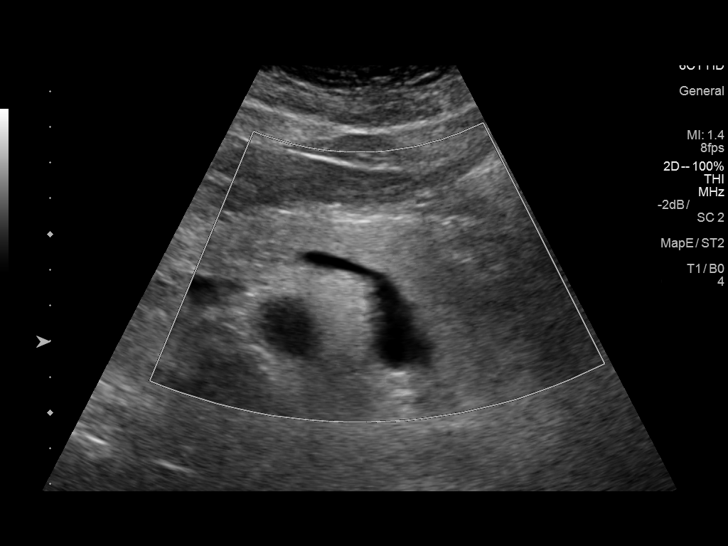
[im 46/85]
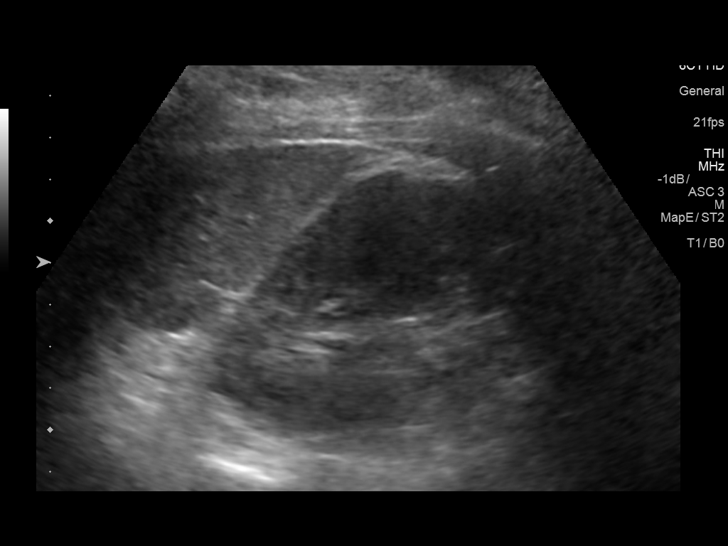
[im 53/85]
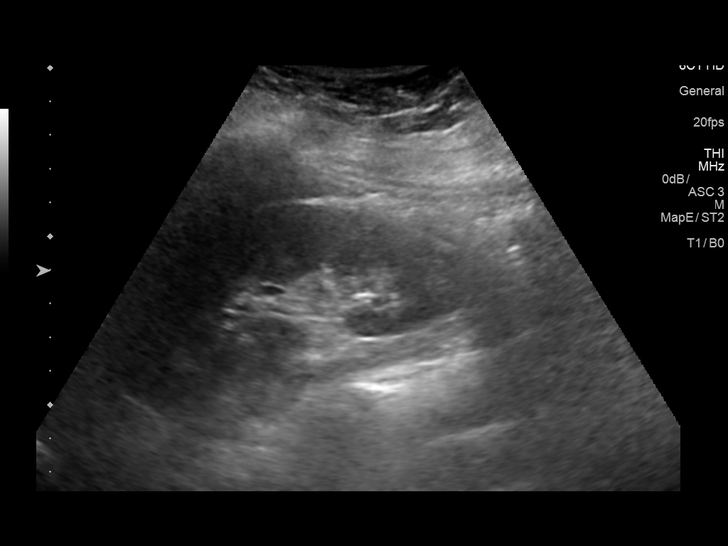
[im 57/85]
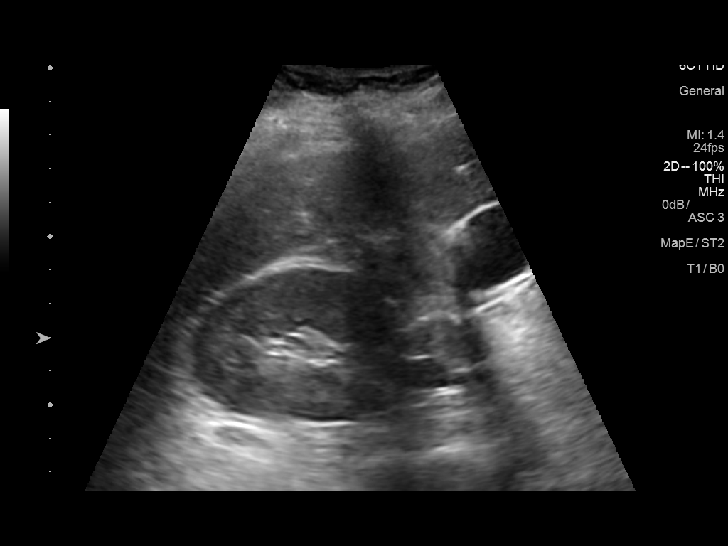
[im 64/85]
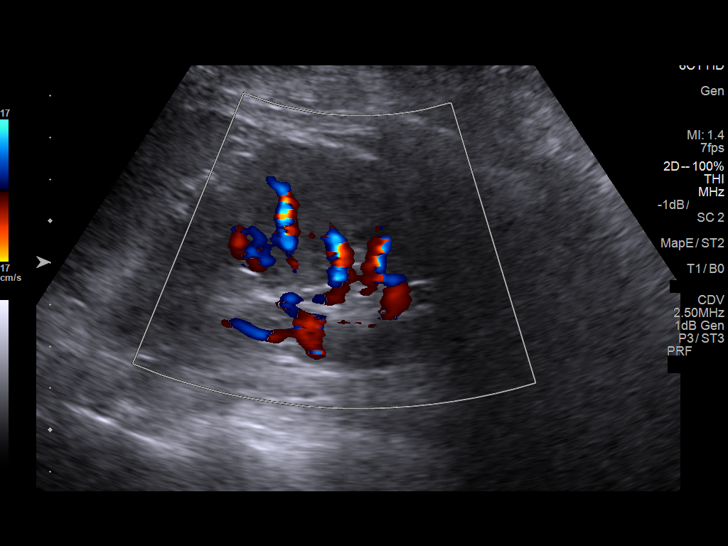
[im 71/85]
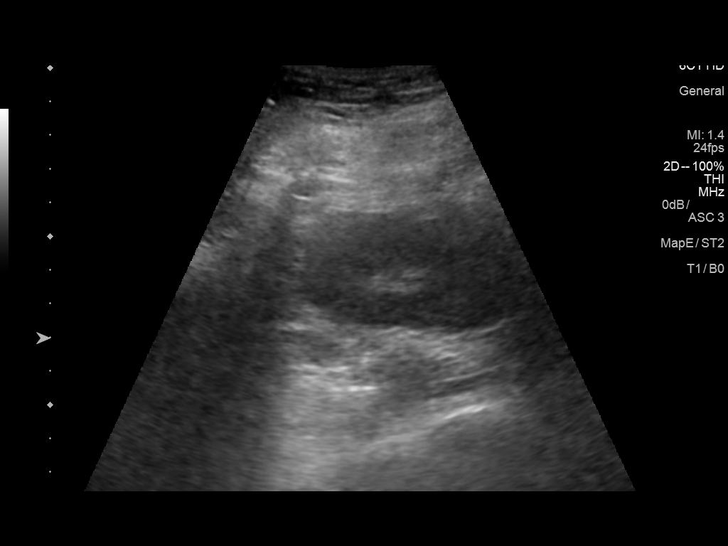
[im 78/85]
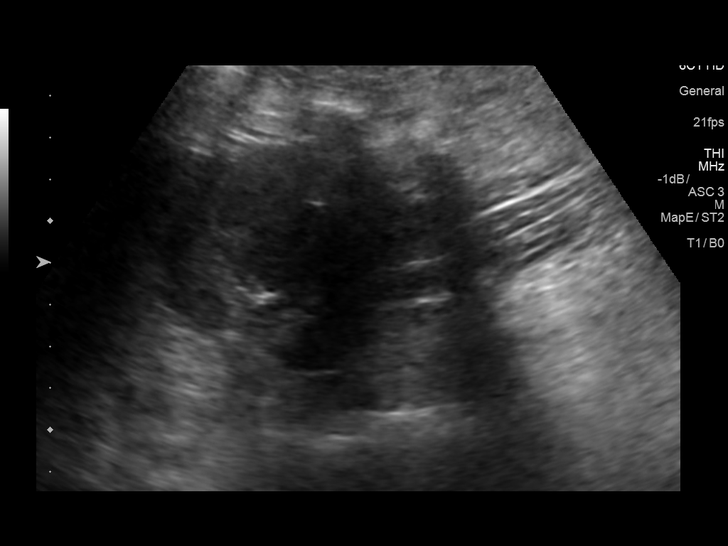
[im 85/85]
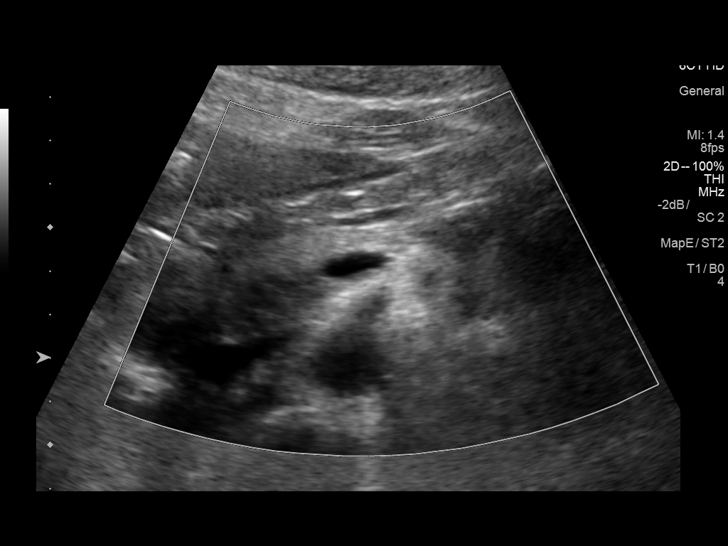

[14 of 25 positions shown; findings below may reference images not displayed]

FINDINGS: Gallbladder: No gallstones or wall thickening visualized. No
sonographic Murphy sign noted by sonographer.

Common bile duct: Diameter: 3.4 mm

Liver: Minimal increased echogenicity may be normal versus minimal
fatty infiltration. No focal mass or intrahepatic biliary duct
dilation.

IVC: No abnormality visualized.

Pancreas: Visualized portion unremarkable.

Spleen: Size and appearance within normal limits.

Right Kidney: Length: 11.1 cm. Echogenicity within normal limits. No
mass or hydronephrosis visualized.

Left Kidney: Length: 11.4 cm. Echogenicity within normal limits. No
mass or hydronephrosis visualized.

Abdominal aorta: No aneurysm visualized.

Other findings: None.
IMPRESSION: Minimal increased echogenicity of the liver may be normal versus
minimal fatty infiltration.

Remainder of exam unremarkable.

## 2019-04-02 ENCOUNTER — Ambulatory Visit: Payer: 59 | Attending: Internal Medicine

## 2019-04-02 DIAGNOSIS — Z23 Encounter for immunization: Secondary | ICD-10-CM

## 2019-04-02 NOTE — Progress Notes (Signed)
   Covid-19 Vaccination Clinic  Name:  Patty [name]    MRN: [id number] DOB: 1971-01-16  04/02/2019  Patty [name] was observed post Covid-19 immunization for 15 minutes without incident. She was provided with Vaccine Information Sheet and instruction to access the V-Safe system.   Patty [name] was instructed to call 911 with any severe reactions post vaccine: Marland Kitchen Difficulty breathing  . Swelling of face and throat  . A fast heartbeat  . A bad rash all over body  . Dizziness and weakness   Immunizations Administered    Name Date Dose VIS Date Route   Pfizer COVID-19 Vaccine 04/02/2019  9:49 AM 0.3 mL 01/01/2019 Intramuscular   Manufacturer: [place]   Lot: [id number]   [place]: [id number]

## 2019-04-26 ENCOUNTER — Ambulatory Visit: Payer: 59 | Attending: Internal Medicine

## 2019-04-26 DIAGNOSIS — Z23 Encounter for immunization: Secondary | ICD-10-CM

## 2019-04-26 NOTE — Progress Notes (Signed)
   Covid-19 Vaccination Clinic  Name:  Patty [name]    MRN: [id number] DOB: 07-29-1970  04/26/2019  [name] was observed post Covid-19 immunization for 15 minutes without incident. She was provided with Vaccine Information Sheet and instruction to access the V-Safe system.   [name] was instructed to call 911 with any severe reactions post vaccine: Marland Kitchen Difficulty breathing  . Swelling of face and throat  . A fast heartbeat  . A bad rash all over body  . Dizziness and weakness   Immunizations Administered    Name Date Dose VIS Date Route   Pfizer COVID-19 Vaccine 04/26/2019 11:50 AM 0.3 mL 01/01/2019 Intramuscular   Manufacturer: [place]   Lot: [id number]   [place]: [id number]

## 2019-07-06 ENCOUNTER — Other Ambulatory Visit: Payer: Self-pay

## 2019-07-06 ENCOUNTER — Ambulatory Visit (INDEPENDENT_AMBULATORY_CARE_PROVIDER_SITE_OTHER): Payer: 59 | Admitting: Plastic Surgery

## 2019-07-06 ENCOUNTER — Encounter: Payer: Self-pay | Admitting: Plastic Surgery

## 2019-07-06 DIAGNOSIS — L732 Hidradenitis suppurativa: Secondary | ICD-10-CM | POA: Diagnosis not present

## 2019-07-06 NOTE — Progress Notes (Signed)
Patient ID: Patty [name], female    DOB: [birth year], 49 y.o.   MRN: [id number]   Chief Complaint  Patient presents with  . Advice Only  . Skin Problem    Patient is a 49 year old female here for evaluation for hidradenitis.  She is very sweet lady.  She has been dealing with this on and off for the past2 5 years.  She was seen by Dr. Stephanie Coup many years ago.  She had flaps to the axillary area which have actually done very well over the years. She was hopeful that it would stop after 25 years.  She has tried medical treatment including Metformin and steroids.  She is now interested in a flap.  The inflamed area is the left labia it is a little bit oozy but not infected at the present time she is not a smoker.  She is 5 feet 8 inches tall and weighs 192 pounds.   Review of Systems  Constitutional: Negative for activity change and appetite change.  Eyes: Negative.   Respiratory: Negative for chest tightness and shortness of breath.   Cardiovascular: Negative for leg swelling.  Gastrointestinal: Negative for abdominal distention and abdominal pain.  Endocrine: Negative.   Genitourinary: Negative.   Musculoskeletal: Negative.   Skin: Positive for color change and wound.  Psychiatric/Behavioral: Negative.     Past Medical History:  Diagnosis Date  . Anemia    negative work up at [facility]  . Asthma   . Cancer ([place])   . Chronic kidney disease     Past Surgical History:  Procedure Laterality Date  . IRRIGATION AND DEBRIDEMENT SEBACEOUS CYST     bilateral axilla  . LAPAROSCOPIC GASTRIC BANDING  2010  . ORIF ANKLE FRACTURE  05/01/2011   Procedure: OPEN REDUCTION INTERNAL FIXATION (ORIF) ANKLE FRACTURE;  Surgeon: Kerin Salen, MD;  Location: [place];  Service: Orthopedics;  Laterality: Right;  . ORIF HUMERUS FRACTURE  05/01/2011   Procedure: OPEN REDUCTION INTERNAL FIXATION (ORIF) DISTAL HUMERUS FRACTURE;  Surgeon: Schuyler Amor, MD;  Location: [place];  Service: Orthopedics;   Laterality: Left;  . OTHER SURGICAL HISTORY     lap banding      Current Outpatient Medications:  .  clindamycin (CLEOCIN) 300 MG capsule, Take 300 mg by mouth 3 (three) times daily., Disp: , Rfl:  .  medroxyPROGESTERone (DEPO-PROVERA) 150 MG/ML injection, , Disp: , Rfl:  .  metFORMIN (GLUCOPHAGE) 500 MG tablet, Take by mouth 2 (two) times daily with a meal., Disp: , Rfl:  .  spironolactone (ALDACTONE) 50 MG tablet, Take 50 mg by mouth daily., Disp: , Rfl:    Objective:   Vitals:   07/06/19 1441  BP: 123/82  Pulse: 96  Temp: 97.7 F (36.5 C)  SpO2: 100%    Physical Exam Vitals and nursing note reviewed.  Constitutional:      Appearance: Normal appearance.  HENT:     Head: Normocephalic and atraumatic.  Cardiovascular:     Rate and Rhythm: Normal rate.     Pulses: Normal pulses.  Pulmonary:     Effort: Pulmonary effort is normal.  Abdominal:     General: Abdomen is flat. There is no distension.  Neurological:     General: No focal deficit present.     Mental Status: She is alert and oriented to person, place, and time.  Psychiatric:        Mood and Affect: Mood normal.  Behavior: Behavior normal.        Thought Content: Thought content normal.     Assessment & Plan:  Hidradenitis suppurativa  Due to her 6 cyst of the flap in the past I think is very reasonable to consider that for now.  She has tried medical management with out resolution.  It does hold things under control but she would like to not have to be continually treating the area.  I think that that is very reasonable.  I have reached out to Dr[facility] Veda Canning at [facility].  We will get her set up for a referral.  She knows that I have not promised her any surgery simply a referral and I hope that he might be able to offer something. Pictures were obtained of the patient and placed in the chart with the patient's or guardian's permission.  [place], DO

## 2019-07-08 ENCOUNTER — Institutional Professional Consult (permissible substitution): Payer: 59 | Admitting: Plastic Surgery

## 2019-12-08 ENCOUNTER — Telehealth: Payer: Self-pay

## 2019-12-08 NOTE — Telephone Encounter (Signed)
Pt returned call re NP appt and req fridays, appt moved and a calendar will be mailed to the pt... AOM

## 2019-12-08 NOTE — Telephone Encounter (Signed)
Called and left a vm with new pt appt information, I have asked the pt to c/b to confirm receipt-approv appt... AOM

## 2020-01-06 ENCOUNTER — Other Ambulatory Visit: Payer: Self-pay | Admitting: Family

## 2020-01-06 ENCOUNTER — Other Ambulatory Visit: Payer: 59

## 2020-01-06 ENCOUNTER — Ambulatory Visit: Payer: 59 | Admitting: Family

## 2020-01-06 DIAGNOSIS — D649 Anemia, unspecified: Secondary | ICD-10-CM

## 2020-01-07 ENCOUNTER — Inpatient Hospital Stay (HOSPITAL_BASED_OUTPATIENT_CLINIC_OR_DEPARTMENT_OTHER): Payer: 59 | Admitting: Family

## 2020-01-07 ENCOUNTER — Telehealth: Payer: Self-pay

## 2020-01-07 ENCOUNTER — Other Ambulatory Visit: Payer: Self-pay

## 2020-01-07 ENCOUNTER — Encounter: Payer: Self-pay | Admitting: Family

## 2020-01-07 ENCOUNTER — Inpatient Hospital Stay: Payer: 59 | Attending: Family

## 2020-01-07 ENCOUNTER — Other Ambulatory Visit: Payer: Self-pay | Admitting: Family

## 2020-01-07 VITALS — BP 110/66 | HR 88 | Temp 98.8°F | Resp 18 | Ht 68.0 in | Wt 205.0 lb

## 2020-01-07 DIAGNOSIS — N189 Chronic kidney disease, unspecified: Secondary | ICD-10-CM | POA: Diagnosis not present

## 2020-01-07 DIAGNOSIS — D509 Iron deficiency anemia, unspecified: Secondary | ICD-10-CM

## 2020-01-07 DIAGNOSIS — D508 Other iron deficiency anemias: Secondary | ICD-10-CM

## 2020-01-07 DIAGNOSIS — D649 Anemia, unspecified: Secondary | ICD-10-CM

## 2020-01-07 DIAGNOSIS — K909 Intestinal malabsorption, unspecified: Secondary | ICD-10-CM

## 2020-01-07 LAB — CMP (CANCER CENTER ONLY)
ALT: 5 U/L (ref 0–44)
AST: 10 U/L — ABNORMAL LOW (ref 15–41)
Albumin: 3.9 g/dL (ref 3.5–5.0)
Alkaline Phosphatase: 68 U/L (ref 38–126)
Anion gap: 9 (ref 5–15)
BUN: 10 mg/dL (ref 6–20)
CO2: 28 mmol/L (ref 22–32)
Calcium: 9.8 mg/dL (ref 8.9–10.3)
Chloride: 102 mmol/L (ref 98–111)
Creatinine: 0.74 mg/dL (ref 0.44–1.00)
GFR, Estimated: >60 mL/min (ref >60)
Glucose, Bld: 94 mg/dL (ref 70–99)
Potassium: 3.8 mmol/L (ref 3.5–5.1)
Sodium: 139 mmol/L (ref 135–145)
Total Bilirubin: 0.3 mg/dL (ref 0.3–1.2)
Total Protein: 7.7 g/dL (ref 6.5–8.1)

## 2020-01-07 LAB — RETICULOCYTES
Immature Retic Fract: 14.8 % (ref 2.3–15.9)
RBC.: 3.46 MIL/uL — ABNORMAL LOW (ref 3.87–5.11)
Retic Count, Absolute: 39.4 10*3/uL (ref 19.0–186.0)
Retic Ct Pct: 1.1 % (ref 0.4–3.1)

## 2020-01-07 LAB — CBC WITH DIFFERENTIAL (CANCER CENTER ONLY)
Abs Immature Granulocytes: 0.19 10*3/uL — ABNORMAL HIGH (ref 0.00–0.07)
Basophils Absolute: 0.1 10*3/uL (ref 0.0–0.1)
Basophils Relative: 0 %
Eosinophils Absolute: 0.2 10*3/uL (ref 0.0–0.5)
Eosinophils Relative: 1 %
HCT: 28.3 % — ABNORMAL LOW (ref 36.0–46.0)
Hemoglobin: 8.8 g/dL — ABNORMAL LOW (ref 12.0–15.0)
Immature Granulocytes: 2 %
Lymphocytes Relative: 17 %
Lymphs Abs: 1.9 10*3/uL (ref 0.7–4.0)
MCH: 25.4 pg — ABNORMAL LOW (ref 26.0–34.0)
MCHC: 31.1 g/dL (ref 30.0–36.0)
MCV: 81.8 fL (ref 80.0–100.0)
Monocytes Absolute: 0.7 10*3/uL (ref 0.1–1.0)
Monocytes Relative: 7 %
Neutro Abs: 8.2 10*3/uL — ABNORMAL HIGH (ref 1.7–7.7)
Neutrophils Relative %: 73 %
Platelet Count: 327 10*3/uL (ref 150–400)
RBC: 3.46 MIL/uL — ABNORMAL LOW (ref 3.87–5.11)
RDW: 16 % — ABNORMAL HIGH (ref 11.5–15.5)
WBC Count: 11.2 10*3/uL — ABNORMAL HIGH (ref 4.0–10.5)
nRBC: 0 % (ref 0.0–0.2)

## 2020-01-07 LAB — SAVE SMEAR(SSMR), FOR PROVIDER SLIDE REVIEW

## 2020-01-07 LAB — LACTATE DEHYDROGENASE: LDH: 135 U/L (ref 98–192)

## 2020-01-07 NOTE — Progress Notes (Signed)
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 She was seen by Dr. Marin Olp around 15 years ago for this same issue.  Hgb is 8.8, MCV 81, WBC count 11.2 and platelets 327.  She is symptomatic with fatigue and being cold natured. She states that her last dose of IV iron was "years ago".  She has failed oral iron due to malabsorption.  She is on Depo injections every 3 months and does not have a cycle. She has 1 child and no history of miscarriage.  She has a boil on the front of her right thigh that has oozed some puss as well as blood. She is currently on Clindamycin.  No abnormal bruising or petechiae.  No sickle cell disease or trait that she is aware of.  She states that she has has had multiple surgeries in the past without any complications.  She has never required a blood transfusion.  Her mother also has history of anemia.  No personal or familial history of cancer.  She was in a clinical trial on a mystery drug to treat her Hidradenitis but was dropped from the trial today due to her Hgb being less than 9. She really once to get back in and is hoping treating her IDA will help.  No diabetes or thyroid disease.  No fever, chills, n/v, cough, rash, dizziness, SOB, chest pain, palpitations,adbominal pain or changes in bowel or bladder habits.  No numbness or tingling in her extremities.  No falls or syncope.  She has had both Pfizer Covid vaccines and booster. She does not have much of an appetite but states  that her weight is stable. She is doing her best to stay well hydrated.  She takes the following supplement: neutroburst, sea moss, collagen, zinc, tumeric and ginger.  No smoking, ETOH or recreational drug use.  She typically exercises 5 days a week but has take the last 2 weeks off due to the boil on her right thigh.  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   ROS: All other 10 point review of systems is negative.   PAST MEDICAL HISTORY:   Past Medical History:  Diagnosis Date  . Anemia    negative work up at [facility]  . Asthma   . Cancer ([place])   . Chronic kidney disease     ALLERGIES: No Known Allergies    MEDICATIONS:  Current Outpatient Medications on File Prior to Visit  Medication Sig Dispense Refill  . clindamycin (CLEOCIN) 300 MG capsule Take 300 mg by mouth 3 (three) times daily.    . medroxyPROGESTERone (DEPO-PROVERA) 150 MG/ML injection     . metFORMIN (GLUCOPHAGE) 500 MG tablet Take by mouth 2 (two) times daily with a meal.    . spironolactone (ALDACTONE) 50 MG tablet Take 50 mg by mouth daily.     No current facility-administered medications on file prior to visit.     PAST SURGICAL HISTORY Past Surgical History:  Procedure Laterality Date  . IRRIGATION AND DEBRIDEMENT SEBACEOUS CYST  bilateral axilla  . LAPAROSCOPIC GASTRIC BANDING  2010  . ORIF ANKLE FRACTURE  05/01/2011   Procedure: OPEN REDUCTION INTERNAL FIXATION (ORIF) ANKLE FRACTURE;  Surgeon: Kerin Salen, MD;  Location: [place];  Service: Orthopedics;  Laterality: Right;  . ORIF HUMERUS FRACTURE  05/01/2011   Procedure: OPEN REDUCTION INTERNAL FIXATION (ORIF) DISTAL HUMERUS FRACTURE;  Surgeon: Schuyler Amor, MD;  Location: [place];  Service: Orthopedics;  Laterality: Left;  . OTHER SURGICAL HISTORY     lap banding    FAMILY HISTORY: Family History  Problem Relation Age of Onset  . Seizures Mother 26    SOCIAL HISTORY:  reports that she has never smoked. She has never used smokeless  tobacco. She reports that she does not drink alcohol and does not use drugs.  PERFORMANCE STATUS: The patient's performance status is 1 - Symptomatic but completely ambulatory  PHYSICAL EXAM: Most Recent Vital Signs: There were no vitals taken for this visit. There were no vitals taken for this visit.  General Appearance:    Alert, cooperative, no distress, appears stated age  Head:    Normocephalic, without obvious abnormality, atraumatic  Eyes:    PERRL, conjunctiva/corneas clear, EOM's intact, fundi    benign, both eyes        Throat:   Lips, mucosa, and tongue normal; teeth and gums normal  Neck:   Supple, symmetrical, trachea midline, no adenopathy;    thyroid:  no enlargement/tenderness/nodules; no carotid   bruit or JVD  Back:     Symmetric, no curvature, ROM normal, no CVA tenderness  Lungs:     Clear to auscultation bilaterally, respirations unlabored  Chest Wall:    No tenderness or deformity   Heart:    Regular rate and rhythm, S1 and S2 normal, no murmur, rub   or gallop     Abdomen:     Soft, non-tender, bowel sounds active all four quadrants,    no masses, no organomegaly        Extremities:   Extremities normal, atraumatic, no cyanosis or edema  Pulses:   2+ and symmetric all extremities  Skin:   Skin color, texture, turgor normal, no rashes or lesions  Lymph nodes:   Cervical, supraclavicular, and axillary nodes normal  Neurologic:   CNII-XII intact, normal strength, sensation and reflexes    throughout    LABORATORY DATA:  No results found for this or any previous visit (from the past 48 hour(s)).    RADIOGRAPHY: No results found.     PATHOLOGY: None    ASSESSMENT/PLAN: Patty [name] is a very pleasant 49 yo [place] American female with a long history of iron deficiency anemia secondary to malabsorption after having lap band surgery in 2010.  She is symptomatic as mentioned above with Hgb 8.8.  We will see what her iron studies look like and replace  intravenously as needed.  Epo, alpha thalassemia and Hgb cascade pending. She may also benefit from folic acid.  Dr. Marin Olp was able to review her blood smear. No abnormality or evidence of malignancy noted.  Follow-up in 6 weeks.   All questions were answered and she is in agreement with the plan. She was encouraged to contact our office with any questions or concerns. We can certainly see her sooner if needed.   The patient was discussed with Dr. Marin Olp and he is in agreement with the aforementioned.   [facility]

## 2020-01-07 NOTE — Telephone Encounter (Signed)
S/w pt and she is aware of her f/u appt per 01/07/20 los...Patty [name]

## 2020-01-10 ENCOUNTER — Other Ambulatory Visit: Payer: Self-pay | Admitting: Family

## 2020-01-10 LAB — IRON AND TIBC
Iron: 25 ug/dL — ABNORMAL LOW (ref 41–142)
Saturation Ratios: 13 % — ABNORMAL LOW (ref 21–57)
TIBC: 185 ug/dL — ABNORMAL LOW (ref 236–444)
UIBC: 160 ug/dL (ref 120–384)

## 2020-01-10 LAB — FERRITIN: Ferritin: 1224 ng/mL — ABNORMAL HIGH (ref 11–307)

## 2020-01-10 LAB — ERYTHROPOIETIN: Erythropoietin: 19.4 m[IU]/mL — ABNORMAL HIGH (ref 2.6–18.5)

## 2020-01-11 ENCOUNTER — Encounter: Payer: Self-pay | Admitting: Family

## 2020-01-11 ENCOUNTER — Telehealth: Payer: Self-pay

## 2020-01-11 LAB — HGB FRACTIONATION CASCADE
Hgb A2: 2.8 % (ref 1.8–3.2)
Hgb A: 97.2 % (ref 96.4–98.8)
Hgb F: 0 % (ref 0.0–2.0)
Hgb S: 0 %

## 2020-01-11 NOTE — Telephone Encounter (Signed)
S/w pt per 01/10/20 inbasket-her iron has been approved and she has been scheduled,,, AOM

## 2020-01-24 ENCOUNTER — Other Ambulatory Visit: Payer: Self-pay

## 2020-01-24 ENCOUNTER — Inpatient Hospital Stay: Payer: 59 | Attending: Family

## 2020-01-24 VITALS — BP 109/74 | HR 79 | Temp 99.1°F | Resp 17

## 2020-01-24 DIAGNOSIS — D509 Iron deficiency anemia, unspecified: Secondary | ICD-10-CM | POA: Insufficient documentation

## 2020-01-24 DIAGNOSIS — K909 Intestinal malabsorption, unspecified: Secondary | ICD-10-CM | POA: Insufficient documentation

## 2020-01-24 DIAGNOSIS — D560 Alpha thalassemia: Secondary | ICD-10-CM | POA: Diagnosis not present

## 2020-01-24 DIAGNOSIS — D508 Other iron deficiency anemias: Secondary | ICD-10-CM

## 2020-01-24 MED ORDER — SODIUM CHLORIDE 0.9 % IV SOLN
200.0000 mg | Freq: Once | INTRAVENOUS | Status: AC
  Administered 2020-01-24: 200 mg via INTRAVENOUS
  Filled 2020-01-24: qty 200

## 2020-01-24 MED ORDER — SODIUM CHLORIDE 0.9 % IV SOLN
Freq: Once | INTRAVENOUS | Status: AC
  Filled 2020-01-24: qty 250

## 2020-01-24 NOTE — Progress Notes (Signed)
Pt discharged in no apparent distress. Pt left ambulatory without assistance. Pt aware of discharge instructions and verbalized understanding and had no further questions.  

## 2020-01-27 ENCOUNTER — Inpatient Hospital Stay: Payer: 59

## 2020-01-27 ENCOUNTER — Other Ambulatory Visit: Payer: Self-pay

## 2020-01-27 VITALS — BP 108/71 | HR 101 | Temp 98.7°F | Resp 16

## 2020-01-27 DIAGNOSIS — D508 Other iron deficiency anemias: Secondary | ICD-10-CM

## 2020-01-27 DIAGNOSIS — D509 Iron deficiency anemia, unspecified: Secondary | ICD-10-CM | POA: Diagnosis not present

## 2020-01-27 MED ORDER — SODIUM CHLORIDE 0.9 % IV SOLN
Freq: Once | INTRAVENOUS | Status: AC
  Filled 2020-01-27: qty 250

## 2020-01-27 MED ORDER — SODIUM CHLORIDE 0.9 % IV SOLN
200.0000 mg | Freq: Once | INTRAVENOUS | Status: AC
  Administered 2020-01-27: 200 mg via INTRAVENOUS
  Filled 2020-01-27: qty 200

## 2020-01-27 NOTE — Patient Instructions (Signed)

## 2020-01-31 ENCOUNTER — Inpatient Hospital Stay: Payer: 59

## 2020-01-31 ENCOUNTER — Other Ambulatory Visit: Payer: Self-pay

## 2020-01-31 ENCOUNTER — Other Ambulatory Visit: Payer: Self-pay | Admitting: Family

## 2020-01-31 VITALS — BP 110/68 | HR 92 | Resp 16

## 2020-01-31 DIAGNOSIS — D508 Other iron deficiency anemias: Secondary | ICD-10-CM

## 2020-01-31 DIAGNOSIS — D509 Iron deficiency anemia, unspecified: Secondary | ICD-10-CM | POA: Diagnosis not present

## 2020-01-31 DIAGNOSIS — D560 Alpha thalassemia: Secondary | ICD-10-CM

## 2020-01-31 LAB — ALPHA-THALASSEMIA GENOTYPR

## 2020-01-31 MED ORDER — FOLIC ACID 1 MG PO TABS: 1.0000 mg | ORAL_TABLET | Freq: Every day | ORAL | 11 refills | Status: AC

## 2020-01-31 MED ORDER — SODIUM CHLORIDE 0.9 % IV SOLN
Freq: Once | INTRAVENOUS | Status: AC
  Filled 2020-01-31: qty 250

## 2020-01-31 MED ORDER — SODIUM CHLORIDE 0.9 % IV SOLN
200.0000 mg | Freq: Once | INTRAVENOUS | Status: AC
  Administered 2020-01-31: 200 mg via INTRAVENOUS
  Filled 2020-01-31: qty 200

## 2020-01-31 NOTE — Patient Instructions (Signed)

## 2020-02-21 ENCOUNTER — Inpatient Hospital Stay: Payer: 59

## 2020-02-21 ENCOUNTER — Other Ambulatory Visit: Payer: Self-pay

## 2020-02-21 ENCOUNTER — Inpatient Hospital Stay (HOSPITAL_BASED_OUTPATIENT_CLINIC_OR_DEPARTMENT_OTHER): Payer: 59 | Admitting: Family

## 2020-02-21 ENCOUNTER — Encounter: Payer: Self-pay | Admitting: Family

## 2020-02-21 VITALS — BP 110/67 | HR 72 | Temp 98.4°F | Resp 18 | Ht 68.0 in | Wt 203.1 lb

## 2020-02-21 DIAGNOSIS — D560 Alpha thalassemia: Secondary | ICD-10-CM

## 2020-02-21 DIAGNOSIS — D509 Iron deficiency anemia, unspecified: Secondary | ICD-10-CM | POA: Diagnosis not present

## 2020-02-21 DIAGNOSIS — K909 Intestinal malabsorption, unspecified: Secondary | ICD-10-CM | POA: Diagnosis not present

## 2020-02-21 DIAGNOSIS — D508 Other iron deficiency anemias: Secondary | ICD-10-CM | POA: Diagnosis not present

## 2020-02-21 DIAGNOSIS — E538 Deficiency of other specified B group vitamins: Secondary | ICD-10-CM | POA: Diagnosis not present

## 2020-02-21 LAB — CBC WITH DIFFERENTIAL (CANCER CENTER ONLY)
Abs Immature Granulocytes: 0.02 10*3/uL (ref 0.00–0.07)
Basophils Absolute: 0 10*3/uL (ref 0.0–0.1)
Basophils Relative: 1 %
Eosinophils Absolute: 0.1 10*3/uL (ref 0.0–0.5)
Eosinophils Relative: 1 %
HCT: 34 % — ABNORMAL LOW (ref 36.0–46.0)
Hemoglobin: 10.8 g/dL — ABNORMAL LOW (ref 12.0–15.0)
Immature Granulocytes: 0 %
Lymphocytes Relative: 26 %
Lymphs Abs: 1.7 10*3/uL (ref 0.7–4.0)
MCH: 26 pg (ref 26.0–34.0)
MCHC: 31.8 g/dL (ref 30.0–36.0)
MCV: 81.9 fL (ref 80.0–100.0)
Monocytes Absolute: 0.5 10*3/uL (ref 0.1–1.0)
Monocytes Relative: 7 %
Neutro Abs: 4.3 10*3/uL (ref 1.7–7.7)
Neutrophils Relative %: 65 %
Platelet Count: 250 10*3/uL (ref 150–400)
RBC: 4.15 MIL/uL (ref 3.87–5.11)
RDW: 17.4 % — ABNORMAL HIGH (ref 11.5–15.5)
WBC Count: 6.7 10*3/uL (ref 4.0–10.5)
nRBC: 0 % (ref 0.0–0.2)

## 2020-02-21 LAB — RETICULOCYTES
Immature Retic Fract: 10.5 % (ref 2.3–15.9)
RBC.: 4.15 MIL/uL (ref 3.87–5.11)
Retic Count, Absolute: 56.4 10*3/uL (ref 19.0–186.0)
Retic Ct Pct: 1.4 % (ref 0.4–3.1)

## 2020-02-21 LAB — VITAMIN B12: Vitamin B-12: 325 pg/mL (ref 180–914)

## 2020-02-21 LAB — IRON AND TIBC
Iron: 54 ug/dL (ref 41–142)
Saturation Ratios: 38 % (ref 21–57)
TIBC: 144 ug/dL — ABNORMAL LOW (ref 236–444)
UIBC: 89 ug/dL — ABNORMAL LOW (ref 120–384)

## 2020-02-21 LAB — FERRITIN: Ferritin: 2353 ng/mL — ABNORMAL HIGH (ref 11–307)

## 2020-02-21 NOTE — Progress Notes (Signed)
Hematology and Oncology Follow Up Visit  Patty [name] [id number] [birth year] 50 y.o. 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 She is doing fairly well but still notes fatigue despite receiving 3 doses of Venofer.  No blood loss noted. No bruising or petechiae.  She denies fever, chills, n/v, cough, rash, dizziness, SOB, chest pain, palpitations, abdominal pain or changes in bowel or bladder habits.  No swelling, tenderness, numbness or tingling in her extremities.  No falls or syncope to report.  Thankfully the wound on her right thigh has healed and she is now only on Avelox for the Hidradenitis suppurativa.  She has maintained a good appetite and is staying well hydrated. Her weight is stable.   ECOG Performance Status: 1 - Symptomatic but completely ambulatory  Medications:  Allergies as of 02/21/2020   No Known Allergies     Medication List       Accurate as of February 21, 2020  9:55 AM. If you have any questions, ask your nurse or doctor.        STOP taking these medications   clindamycin 300 MG capsule Commonly known as: CLEOCIN Stopped by: Laverna Peace, NP   ferrous sulfate 325 (65 FE) MG EC tablet Stopped by: Laverna Peace, NP     TAKE these medications   folic acid 1 MG tablet Commonly known as: FOLVITE Take 1 tablet (1 mg total) by mouth daily.   medroxyPROGESTERone 150 MG/ML injection Commonly known as: DEPO-PROVERA   moxifloxacin 400 MG tablet Commonly known as: AVELOX Take 400 mg by mouth daily.       Allergies: No Known Allergies  Past Medical History, Surgical history, Social history, and Family History were reviewed and updated.  Review of Systems: All other 10 point review of systems is negative.   Physical  Exam:  height is 5\' 8"  (1.727 m) and weight is 203 lb 1.9 oz (92.1 kg). Her oral temperature is 98.4 F (36.9 C). Her blood pressure is 110/67 and her pulse is 72. Her respiration is 18 and oxygen saturation is 100%.   Wt Readings from Last 3 Encounters:  02/21/20 203 lb 1.9 oz (92.1 kg)  01/07/20 205 lb (93 kg)  07/06/19 192 lb (87.1 kg)    Ocular: Sclerae unicteric, pupils equal, round and reactive to light Ear-nose-throat: Oropharynx clear, dentition fair Lymphatic: No cervical or supraclavicular adenopathy Lungs no rales or rhonchi, good excursion bilaterally Heart regular rate and rhythm, no murmur appreciated Abd soft, nontender, positive bowel sounds MSK no focal spinal tenderness, no joint edema Neuro: non-focal, well-oriented, appropriate affect Breasts: Deferred   Lab Results  Component Value Date   WBC 6.7 02/21/2020   HGB 10.8 (L) 02/21/2020   HCT 34.0 (L) 02/21/2020   MCV 81.9 02/21/2020   PLT 250 02/21/2020   Lab Results  Component Value Date   FERRITIN 1,224 (H) 01/07/2020   IRON 25 (L) 01/07/2020   TIBC 185 (L) 01/07/2020   UIBC 160 01/07/2020   IRONPCTSAT 13 (L) 01/07/2020   Lab Results  Component Value Date   RETICCTPCT 1.4 02/21/2020   RBC 4.15 02/21/2020   RBC 4.15 02/21/2020   No results found for: KPAFRELGTCHN, LAMBDASER, KAPLAMBRATIO No results found for: IGGSERUM, IGA, IGMSERUM No results found for:  Ronnald Ramp, A1GS, A2GS, Tillman Sers, SPEI   Chemistry      Component Value Date/Time   NA 139 01/07/2020 1305   K 3.8 01/07/2020 1305   CL 102 01/07/2020 1305   CO2 28 01/07/2020 1305   BUN 10 01/07/2020 1305   CREATININE 0.74 01/07/2020 1305      Component Value Date/Time   CALCIUM 9.8 01/07/2020 1305   ALKPHOS 68 01/07/2020 1305   AST 10 (L) 01/07/2020 1305   ALT 5 01/07/2020 1305   BILITOT 0.3 01/07/2020 1305       Impression and Plan: Patty [name] is a very pleasant 50 yo African American female with  a long history of iron deficiency anemia secondary to malabsorption after having lap band surgery in 2010.  She tolerated IV iron nicely and Hgb has improved.  Iron studies are pending. We will replace again if needed.  She is doing well on daily folic acid for alpha thalassemia.  B 12 is pending.  Follow-up in 3 months.  She can contact our office with any questions or concerns.   Laverna Peace, NP 1/31/20229:55 AM

## 2020-05-22 ENCOUNTER — Inpatient Hospital Stay: Payer: 59 | Admitting: Family

## 2020-05-22 ENCOUNTER — Inpatient Hospital Stay: Payer: 59 | Attending: Obstetrics and Gynecology

## 2022-04-29 ENCOUNTER — Encounter (HOSPITAL_BASED_OUTPATIENT_CLINIC_OR_DEPARTMENT_OTHER): Payer: Self-pay | Admitting: Student

## 2022-04-29 ENCOUNTER — Encounter: Payer: Self-pay | Admitting: Family

## 2022-04-29 ENCOUNTER — Ambulatory Visit (HOSPITAL_BASED_OUTPATIENT_CLINIC_OR_DEPARTMENT_OTHER): Payer: 59

## 2022-04-29 ENCOUNTER — Ambulatory Visit (INDEPENDENT_AMBULATORY_CARE_PROVIDER_SITE_OTHER): Payer: 59 | Admitting: Student

## 2022-04-29 DIAGNOSIS — M7022 Olecranon bursitis, left elbow: Secondary | ICD-10-CM

## 2022-04-29 DIAGNOSIS — M25522 Pain in left elbow: Secondary | ICD-10-CM
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
## 2022-05-01 ENCOUNTER — Ambulatory Visit (HOSPITAL_BASED_OUTPATIENT_CLINIC_OR_DEPARTMENT_OTHER): Payer: Self-pay | Admitting: Orthopaedic Surgery

## 2022-05-01 ENCOUNTER — Encounter (HOSPITAL_COMMUNITY): Payer: Self-pay | Admitting: Orthopaedic Surgery

## 2022-05-01 ENCOUNTER — Other Ambulatory Visit (HOSPITAL_BASED_OUTPATIENT_CLINIC_OR_DEPARTMENT_OTHER): Payer: Self-pay

## 2022-05-01 ENCOUNTER — Ambulatory Visit (INDEPENDENT_AMBULATORY_CARE_PROVIDER_SITE_OTHER): Payer: 59 | Admitting: Orthopaedic Surgery

## 2022-05-01 ENCOUNTER — Other Ambulatory Visit: Payer: Self-pay

## 2022-05-01 ENCOUNTER — Encounter: Payer: Self-pay | Admitting: Family

## 2022-05-01 DIAGNOSIS — M7022 Olecranon bursitis, left elbow: Secondary | ICD-10-CM

## 2022-05-01 MED ORDER — IBUPROFEN 800 MG PO TABS
800.0000 mg | ORAL_TABLET | Freq: Three times a day (TID) | ORAL | 0 refills | Status: AC
Start: 2022-05-01 — End: 2022-05-11
  Filled 2022-05-01: qty 30, 10d supply, fill #0

## 2022-05-01 MED ORDER — OXYCODONE HCL 5 MG PO TABS
5.0000 mg | ORAL_TABLET | ORAL | 0 refills | Status: AC | PRN
  Filled 2022-05-01: qty 5, 1d supply, fill #0

## 2022-05-01 MED ORDER — ACETAMINOPHEN 500 MG PO TABS
500.0000 mg | ORAL_TABLET | Freq: Three times a day (TID) | ORAL | 0 refills | Status: AC
  Filled 2022-05-01: qty 30, 10d supply, fill #0

## 2022-05-01 NOTE — Progress Notes (Signed)
Chief Complaint: Left elbow swelling     History of Present Illness:    Patty [name] is a 52 y.o. female presents today with several weeks of left elbow swelling about the posterior bursa which has been ongoing.  She denies any redness or drainage.  This has been recurrently painful and bothersome since her previous open reduction internal fixation which occurred 10 years prior.  She was seen by Hazle Nordmann, PA who recommended follow-up with me for discussion of possible removal given the fact that this has persistently been symptomatic.  She works at [employer] locally here in [place].  She is very active.    Surgical History:   None  PMH/PSH/Family History/Social History/Meds/Allergies:    Past Medical History:  Diagnosis Date   Anemia    negative work up at [facility]   Asthma    Cancer    Chronic kidney disease    Past Surgical History:  Procedure Laterality Date   IRRIGATION AND DEBRIDEMENT SEBACEOUS CYST     bilateral axilla   LAPAROSCOPIC GASTRIC BANDING  2010   ORIF ANKLE FRACTURE  05/01/2011   Procedure: OPEN REDUCTION INTERNAL FIXATION (ORIF) ANKLE FRACTURE;  Surgeon: Nestor Lewandowsky, MD;  Location: [facility] OR;  Service: Orthopedics;  Laterality: Right;   ORIF HUMERUS FRACTURE  05/01/2011   Procedure: OPEN REDUCTION INTERNAL FIXATION (ORIF) DISTAL HUMERUS FRACTURE;  Surgeon: Marlowe Shores, MD;  Location: [facility] OR;  Service: Orthopedics;  Laterality: Left;   OTHER SURGICAL HISTORY     lap banding   Social History   Socioeconomic History   Marital status: Married    Spouse name: Not on file   Number of children: Not on file   Years of education: Not on file   Highest education level: Not on file  Occupational History   Not on file  Tobacco Use   Smoking status: Never   Smokeless tobacco: Never  Vaping Use   Vaping Use: Never used  Substance and Sexual Activity   Alcohol use: No   Drug use: No   Sexual activity:  Not on file  Other Topics Concern   Not on file  Social History Narrative   Not on file   Social Determinants of Health   Financial Resource Strain: Not on file  Food Insecurity: Not on file  Transportation Needs: Not on file  Physical Activity: Not on file  Stress: Not on file  Social Connections: Not on file   Family History  Problem Relation Age of Onset   Seizures Mother 30   No Known Allergies Current Outpatient Medications  Medication Sig Dispense Refill   acetaminophen (TYLENOL) 500 MG tablet Take 1 tablet (500 mg total) by mouth every 8 (eight) hours for 10 days. 30 tablet 0   ibuprofen (ADVIL) 800 MG tablet Take 1 tablet (800 mg total) by mouth every 8 (eight) hours for 10 days. Please take with food, please alternate with acetaminophen 30 tablet 0   oxyCODONE (OXY IR/ROXICODONE) 5 MG immediate release tablet Take 1 tablet (5 mg total) by mouth every 4 (four) hours as needed (severe pain). 5 tablet 0   folic acid (FOLVITE) 1 MG tablet Take 1 tablet (1 mg total) by mouth daily. 30 tablet 11   medroxyPROGESTERone (DEPO-PROVERA) 150 MG/ML injection  moxifloxacin (AVELOX) 400 MG tablet Take 400 mg by mouth daily.     No current facility-administered medications for this visit.   DG Elbow 2 Views Left  Result Date: 04/30/2022 CLINICAL DATA:  Pain. Left elbow pain. EXAM: LEFT ELBOW - 2 VIEW COMPARISON:  Preoperative radiograph 04/29/2011 FINDINGS: Medial and lateral plate and multi screw fixation of previous distal humerus fracture. K-wire and cerclage wire fixation of the proximal ulna. The K-wires project 8 mm into the soft tissues posteriorly. Previous distal humerus fracture has healed with residual posttraumatic deformity. Large area of soft tissue thickening in the region of the olecranon bursa. This overlies the K-wires and cerclage wire. Suspected elbow joint effusion. No evidence of acute fracture. No erosive change. IMPRESSION: 1. Large area of soft tissue thickening  in the region of the olecranon bursa, may represent olecranon bursitis or hematoma. Sterility is indeterminate by imaging. This overlies K-wire fixation of the proximal ulna. 2. Fixation hardware in the distal humerus and proximal ulna. K-wires project 8 mm into the soft tissues posteriorly. 3. Possible joint effusion. Electronically Signed   By: Narda RutherfordMelanie  Sanford M.D.   On: 04/30/2022 19:39    Review of Systems:   A ROS was performed including pertinent positives and negatives as documented in the HPI.  Physical Exam :   Constitutional: NAD and appears stated age Neurological: Alert and oriented Psych: Appropriate affect and cooperative There were no vitals taken for this visit.   Comprehensive Musculoskeletal Exam:    Left elbow range of motion is 0 to 140 degrees.  There is a swollen bursa with fluid collection.  There is no redness or drainage.  She is actively able to extend.  Full pro supination.  Imaging:   Xray (3 views left elbow): There is displacement of her previous K wires in her olecranon fixation   I personally reviewed and interpreted the radiographs.   Assessment:   52 y.o. female with left olecranon symptomatic hardware as result of backing out of the K wires in the right elbow that is likely causing bursal irritation.  I did describe that while we could aspirate this bursa I did not believe that she would get prolonged relief from this as it is very likely that these K wires are causing bursal irritation and accumulation of swelling.  We did discuss surgical removal of hardware.  Specifically I do not believe there is evidence of infection and to that effect I think a more minimal dissection was just removal of her K wires and bursal resection will provide her with the best recovery.  I did discuss that if there is any evidence of infection that I would recommend surgical removal of the hardware the medial and lateral humerus as well.  I discussed that we will plan to place  a wound VAC as well.  Will plan to proceed with surgical removal of hardware and bursal debridement after further discussion  Plan :    -Plan for left elbow removal of hardware and bursectomy   After a lengthy discussion of treatment options, including risks, benefits, alternatives, complications of surgical and nonsurgical conservative options, the patient elected surgical repair.   The patient  is aware of the material risks  and complications including, but not limited to injury to adjacent structures, neurovascular injury, infection, numbness, bleeding, implant failure, thermal burns, stiffness, persistent pain, failure to heal, disease transmission from allograft, need for further surgery, dislocation, anesthetic risks, blood clots, risks of death,and others. The probabilities of surgical  success and failure discussed with patient given their particular co-morbidities.The time and nature of expected rehabilitation and recovery was discussed.The patient's questions were all answered preoperatively.  No barriers to understanding were noted. I explained the natural history of the disease process and Rx rationale.  I explained to the patient what I considered to be reasonable expectations given their personal situation.  The final treatment plan was arrived at through a shared patient decision making process model.      I personally saw and evaluated the patient, and participated in the management and treatment plan.  Huel Cote, MD Attending Physician, Orthopedic Surgery  This document was dictated using Dragon voice recognition software. A reasonable attempt at proof reading has been made to minimize errors.

## 2022-05-01 NOTE — H&P (View-Only) (Signed)
Chief Complaint: Left elbow swelling     History of Present Illness:    Patty [name] is a 52 y.o. female presents today with several weeks of left elbow swelling about the posterior bursa which has been ongoing.  She denies any redness or drainage.  This has been recurrently painful and bothersome since her previous open reduction internal fixation which occurred 10 years prior.  She was seen by Hazle Nordmann, PA who recommended follow-up with me for discussion of possible removal given the fact that this has persistently been symptomatic.  She works at [employer] locally here in [place].  She is very active.    Surgical History:   None  PMH/PSH/Family History/Social History/Meds/Allergies:    Past Medical History:  Diagnosis Date   Anemia    negative work up at [facility]   Asthma    Cancer    Chronic kidney disease    Past Surgical History:  Procedure Laterality Date   IRRIGATION AND DEBRIDEMENT SEBACEOUS CYST     bilateral axilla   LAPAROSCOPIC GASTRIC BANDING  2010   ORIF ANKLE FRACTURE  05/01/2011   Procedure: OPEN REDUCTION INTERNAL FIXATION (ORIF) ANKLE FRACTURE;  Surgeon: Nestor Lewandowsky, MD;  Location: [facility] OR;  Service: Orthopedics;  Laterality: Right;   ORIF HUMERUS FRACTURE  05/01/2011   Procedure: OPEN REDUCTION INTERNAL FIXATION (ORIF) DISTAL HUMERUS FRACTURE;  Surgeon: Marlowe Shores, MD;  Location: [facility] OR;  Service: Orthopedics;  Laterality: Left;   OTHER SURGICAL HISTORY     lap banding   Social History   Socioeconomic History   Marital status: Married    Spouse name: Not on file   Number of children: Not on file   Years of education: Not on file   Highest education level: Not on file  Occupational History   Not on file  Tobacco Use   Smoking status: Never   Smokeless tobacco: Never  Vaping Use   Vaping Use: Never used  Substance and Sexual Activity   Alcohol use: No   Drug use: No   Sexual activity:  Not on file  Other Topics Concern   Not on file  Social History Narrative   Not on file   Social Determinants of Health   Financial Resource Strain: Not on file  Food Insecurity: Not on file  Transportation Needs: Not on file  Physical Activity: Not on file  Stress: Not on file  Social Connections: Not on file   Family History  Problem Relation Age of Onset   Seizures Mother 30   No Known Allergies Current Outpatient Medications  Medication Sig Dispense Refill   acetaminophen (TYLENOL) 500 MG tablet Take 1 tablet (500 mg total) by mouth every 8 (eight) hours for 10 days. 30 tablet 0   ibuprofen (ADVIL) 800 MG tablet Take 1 tablet (800 mg total) by mouth every 8 (eight) hours for 10 days. Please take with food, please alternate with acetaminophen 30 tablet 0   oxyCODONE (OXY IR/ROXICODONE) 5 MG immediate release tablet Take 1 tablet (5 mg total) by mouth every 4 (four) hours as needed (severe pain). 5 tablet 0   folic acid (FOLVITE) 1 MG tablet Take 1 tablet (1 mg total) by mouth daily. 30 tablet 11   medroxyPROGESTERone (DEPO-PROVERA) 150 MG/ML injection  moxifloxacin (AVELOX) 400 MG tablet Take 400 mg by mouth daily.     No current facility-administered medications for this visit.   DG Elbow 2 Views Left  Result Date: 04/30/2022 CLINICAL DATA:  Pain. Left elbow pain. EXAM: LEFT ELBOW - 2 VIEW COMPARISON:  Preoperative radiograph 04/29/2011 FINDINGS: Medial and lateral plate and multi screw fixation of previous distal humerus fracture. K-wire and cerclage wire fixation of the proximal ulna. The K-wires project 8 mm into the soft tissues posteriorly. Previous distal humerus fracture has healed with residual posttraumatic deformity. Large area of soft tissue thickening in the region of the olecranon bursa. This overlies the K-wires and cerclage wire. Suspected elbow joint effusion. No evidence of acute fracture. No erosive change. IMPRESSION: 1. Large area of soft tissue thickening  in the region of the olecranon bursa, may represent olecranon bursitis or hematoma. Sterility is indeterminate by imaging. This overlies K-wire fixation of the proximal ulna. 2. Fixation hardware in the distal humerus and proximal ulna. K-wires project 8 mm into the soft tissues posteriorly. 3. Possible joint effusion. Electronically Signed   By: Melanie  Sanford M.D.   On: 04/30/2022 19:39    Review of Systems:   A ROS was performed including pertinent positives and negatives as documented in the HPI.  Physical Exam :   Constitutional: NAD and appears stated age Neurological: Alert and oriented Psych: Appropriate affect and cooperative There were no vitals taken for this visit.   Comprehensive Musculoskeletal Exam:    Left elbow range of motion is 0 to 140 degrees.  There is a swollen bursa with fluid collection.  There is no redness or drainage.  She is actively able to extend.  Full pro supination.  Imaging:   Xray (3 views left elbow): There is displacement of her previous K wires in her olecranon fixation   I personally reviewed and interpreted the radiographs.   Assessment:   52 y.o. female with left olecranon symptomatic hardware as result of backing out of the K wires in the right elbow that is likely causing bursal irritation.  I did describe that while we could aspirate this bursa I did not believe that she would get prolonged relief from this as it is very likely that these K wires are causing bursal irritation and accumulation of swelling.  We did discuss surgical removal of hardware.  Specifically I do not believe there is evidence of infection and to that effect I think a more minimal dissection was just removal of her K wires and bursal resection will provide her with the best recovery.  I did discuss that if there is any evidence of infection that I would recommend surgical removal of the hardware the medial and lateral humerus as well.  I discussed that we will plan to place  a wound VAC as well.  Will plan to proceed with surgical removal of hardware and bursal debridement after further discussion  Plan :    -Plan for left elbow removal of hardware and bursectomy   After a lengthy discussion of treatment options, including risks, benefits, alternatives, complications of surgical and nonsurgical conservative options, the patient elected surgical repair.   The patient  is aware of the material risks  and complications including, but not limited to injury to adjacent structures, neurovascular injury, infection, numbness, bleeding, implant failure, thermal burns, stiffness, persistent pain, failure to heal, disease transmission from allograft, need for further surgery, dislocation, anesthetic risks, blood clots, risks of death,and others. The probabilities of surgical   success and failure discussed with patient given their particular co-morbidities.The time and nature of expected rehabilitation and recovery was discussed.The patient's questions were all answered preoperatively.  No barriers to understanding were noted. I explained the natural history of the disease process and Rx rationale.  I explained to the patient what I considered to be reasonable expectations given their personal situation.  The final treatment plan was arrived at through a shared patient decision making process model.      I personally saw and evaluated the patient, and participated in the management and treatment plan.  Huel Cote, MD Attending Physician, Orthopedic Surgery  This document was dictated using Dragon voice recognition software. A reasonable attempt at proof reading has been made to minimize errors.

## 2022-05-01 NOTE — Progress Notes (Signed)
SDW call  Patient was given pre-op instructions over the phone. Patient verbalized understanding of instructions provided.     PCP -    PPM/ICD - Denies   Chest x-ray - n/a EKG -  05/07/2011 Stress Test - ECHO - 05/01/2011 Cardiac Cath -   Sleep Study/sleep apnea/CPAP: Denies  Non-diabetic   Blood Thinner Instructions: Denies Aspirin Instructions:Denies   ERAS Protcol - Yes, clear liquids until 1115 PRE-SURGERY Ensure or G2- No   COVID TEST- n/a    Anesthesia review: No   Patient denies shortness of breath, fever, cough and chest pain over the phone call    Your procedure is scheduled on Thursday May 02, 2022  Report to [facility] Main Entrance "A" at  1145  A.M., then check in with the Admitting office.  Call this number if you have problems the morning of surgery:  [phone]   If you have any questions prior to your surgery date call [phone]: Open Monday-Friday 8am-4pm If you experience any cold or flu symptoms such as cough, fever, chills, shortness of breath, etc. between now and your scheduled surgery, please notify us at the above number     Remember:  Do not eat after midnight the night before your surgery  You may drink clear liquids until  1115   the morning of your surgery.   Clear liquids allowed are: Water, Non-Citrus Juices (without pulp), Carbonated Beverages, Clear Tea, Black Coffee ONLY (NO MILK, CREAM OR POWDERED CREAMER of any kind), and Gatorade   Take these medicines as needed the morning of surgery with A SIP OF WATER:  Tylenol  As of today, STOP taking any Aspirin (unless otherwise instructed by your surgeon) Aleve, Naproxen, Ibuprofen, Motrin, Advil, Goody's, BC's, all herbal medications, fish oil, and all vitamins.

## 2022-05-02 ENCOUNTER — Ambulatory Visit (HOSPITAL_BASED_OUTPATIENT_CLINIC_OR_DEPARTMENT_OTHER): Payer: 59 | Admitting: Certified Registered Nurse Anesthetist

## 2022-05-02 ENCOUNTER — Ambulatory Visit (HOSPITAL_COMMUNITY): Payer: 59 | Admitting: Certified Registered Nurse Anesthetist

## 2022-05-02 ENCOUNTER — Ambulatory Visit (HOSPITAL_COMMUNITY)
Admission: RE | Admit: 2022-05-02 | Discharge: 2022-05-02 | Disposition: A | Discharge status: Home / Self Care | Payer: 59 | Attending: Orthopaedic Surgery | Admitting: Orthopaedic Surgery

## 2022-05-02 ENCOUNTER — Ambulatory Visit (HOSPITAL_COMMUNITY): Payer: 59

## 2022-05-02 ENCOUNTER — Other Ambulatory Visit: Payer: Self-pay

## 2022-05-02 ENCOUNTER — Encounter (HOSPITAL_COMMUNITY)
Admission: RE | Disposition: A | Discharge status: Home / Self Care | Payer: Self-pay | Source: Home / Self Care | Attending: Orthopaedic Surgery

## 2022-05-02 ENCOUNTER — Encounter (HOSPITAL_COMMUNITY): Payer: Self-pay | Admitting: Orthopaedic Surgery

## 2022-05-02 DIAGNOSIS — D649 Anemia, unspecified: Secondary | ICD-10-CM | POA: Diagnosis not present

## 2022-05-02 DIAGNOSIS — M7022 Olecranon bursitis, left elbow: Secondary | ICD-10-CM

## 2022-05-02 DIAGNOSIS — D759 Disease of blood and blood-forming organs, unspecified: Secondary | ICD-10-CM | POA: Insufficient documentation

## 2022-05-02 DIAGNOSIS — Z472 Encounter for removal of internal fixation device: Secondary | ICD-10-CM | POA: Diagnosis not present

## 2022-05-02 HISTORY — PX: HARDWARE REMOVAL: SHX979

## 2022-05-02 HISTORY — DX: Unspecified osteoarthritis, unspecified site: M19.90

## 2022-05-02 SURGERY — REMOVAL, HARDWARE
Anesthesia: General | Site: Elbow | Laterality: Left

## 2022-05-02 MED ORDER — CHLORHEXIDINE GLUCONATE 0.12 % MT SOLN
15.0000 mL | Freq: Once | OROMUCOSAL | Status: AC
  Administered 2022-05-02: 15 mL via OROMUCOSAL
  Filled 2022-05-02: qty 15

## 2022-05-02 MED ORDER — MIDAZOLAM HCL 2 MG/2ML IJ SOLN: 1.0000 mg | Freq: Once | INTRAMUSCULAR | Status: AC

## 2022-05-02 MED ORDER — DEXAMETHASONE SODIUM PHOSPHATE 10 MG/ML IJ SOLN
INTRAMUSCULAR | Status: AC
  Filled 2022-05-02: qty 2

## 2022-05-02 MED ORDER — DEXAMETHASONE SODIUM PHOSPHATE 10 MG/ML IJ SOLN
INTRAMUSCULAR | Status: DC | PRN
  Administered 2022-05-02: 10 mg via INTRAVENOUS

## 2022-05-02 MED ORDER — ACETAMINOPHEN 500 MG PO TABS
1000.0000 mg | ORAL_TABLET | Freq: Once | ORAL | Status: AC
  Administered 2022-05-02: 1000 mg via ORAL
  Filled 2022-05-02: qty 2

## 2022-05-02 MED ORDER — LIDOCAINE 2% (20 MG/ML) 5 ML SYRINGE
INTRAMUSCULAR | Status: AC
  Filled 2022-05-02: qty 10

## 2022-05-02 MED ORDER — BUPIVACAINE LIPOSOME 1.3 % IJ SUSP
INTRAMUSCULAR | Status: DC | PRN
  Administered 2022-05-02: 10 mL via PERINEURAL

## 2022-05-02 MED ORDER — HYDROMORPHONE HCL 1 MG/ML IJ SOLN: 0.2500 mg | INTRAMUSCULAR | Status: DC | PRN

## 2022-05-02 MED ORDER — ORAL CARE MOUTH RINSE: 15.0000 mL | Freq: Once | OROMUCOSAL | Status: AC

## 2022-05-02 MED ORDER — OXYCODONE HCL 5 MG/5ML PO SOLN: 5.0000 mg | Freq: Once | ORAL | Status: DC | PRN

## 2022-05-02 MED ORDER — OXYCODONE HCL 5 MG PO TABS: 5.0000 mg | ORAL_TABLET | Freq: Once | ORAL | Status: DC | PRN

## 2022-05-02 MED ORDER — BUPIVACAINE HCL (PF) 0.5 % IJ SOLN
INTRAMUSCULAR | Status: DC | PRN
  Administered 2022-05-02: 20 mL via PERINEURAL

## 2022-05-02 MED ORDER — MIDAZOLAM HCL 2 MG/2ML IJ SOLN
INTRAMUSCULAR | Status: AC
  Administered 2022-05-02: 1 mg via INTRAVENOUS
  Filled 2022-05-02: qty 2

## 2022-05-02 MED ORDER — FENTANYL CITRATE (PF) 250 MCG/5ML IJ SOLN
INTRAMUSCULAR | Status: AC
  Filled 2022-05-02: qty 5

## 2022-05-02 MED ORDER — ONDANSETRON HCL 4 MG/2ML IJ SOLN
INTRAMUSCULAR | Status: AC
  Filled 2022-05-02: qty 4

## 2022-05-02 MED ORDER — FENTANYL CITRATE (PF) 250 MCG/5ML IJ SOLN
INTRAMUSCULAR | Status: DC | PRN
  Administered 2022-05-02: 50 ug via INTRAVENOUS

## 2022-05-02 MED ORDER — PROPOFOL 500 MG/50ML IV EMUL
INTRAVENOUS | Status: DC | PRN
  Administered 2022-05-02: 50 ug/kg/min via INTRAVENOUS

## 2022-05-02 MED ORDER — FENTANYL CITRATE (PF) 100 MCG/2ML IJ SOLN: 50.0000 ug | Freq: Once | INTRAMUSCULAR | Status: AC

## 2022-05-02 MED ORDER — LIDOCAINE 2% (20 MG/ML) 5 ML SYRINGE
INTRAMUSCULAR | Status: DC | PRN
  Administered 2022-05-02: 100 mg via INTRAVENOUS

## 2022-05-02 MED ORDER — CEFAZOLIN SODIUM-DEXTROSE 2-4 GM/100ML-% IV SOLN
2.0000 g | INTRAVENOUS | Status: AC
  Administered 2022-05-02: 2 g via INTRAVENOUS
  Filled 2022-05-02: qty 100

## 2022-05-02 MED ORDER — PROPOFOL 10 MG/ML IV BOLUS
INTRAVENOUS | Status: DC | PRN
  Administered 2022-05-02: 200 mg via INTRAVENOUS

## 2022-05-02 MED ORDER — ROCURONIUM BROMIDE 10 MG/ML (PF) SYRINGE
PREFILLED_SYRINGE | INTRAVENOUS | Status: DC | PRN
  Administered 2022-05-02: 50 mg via INTRAVENOUS

## 2022-05-02 MED ORDER — PHENYLEPHRINE HCL (PRESSORS) 10 MG/ML IV SOLN
INTRAVENOUS | Status: DC | PRN
  Administered 2022-05-02 (×2): 160 ug via INTRAVENOUS

## 2022-05-02 MED ORDER — 0.9 % SODIUM CHLORIDE (POUR BTL) OPTIME
TOPICAL | Status: DC | PRN
  Administered 2022-05-02: 1000 mL

## 2022-05-02 MED ORDER — LACTATED RINGERS IV SOLN: INTRAVENOUS | Status: DC

## 2022-05-02 MED ORDER — ONDANSETRON HCL 4 MG/2ML IJ SOLN: 4.0000 mg | Freq: Once | INTRAMUSCULAR | Status: DC | PRN

## 2022-05-02 MED ORDER — BUPIVACAINE LIPOSOME 1.3 % IJ SUSP
INTRAMUSCULAR | Status: AC
  Filled 2022-05-02: qty 10

## 2022-05-02 MED ORDER — PHENYLEPHRINE 80 MCG/ML (10ML) SYRINGE FOR IV PUSH (FOR BLOOD PRESSURE SUPPORT)
PREFILLED_SYRINGE | INTRAVENOUS | Status: AC
  Filled 2022-05-02: qty 10

## 2022-05-02 MED ORDER — FENTANYL CITRATE (PF) 100 MCG/2ML IJ SOLN
INTRAMUSCULAR | Status: AC
  Administered 2022-05-02: 50 ug via INTRAVENOUS
  Filled 2022-05-02: qty 2

## 2022-05-02 MED ORDER — ONDANSETRON HCL 4 MG/2ML IJ SOLN
INTRAMUSCULAR | Status: DC | PRN
  Administered 2022-05-02: 4 mg via INTRAVENOUS

## 2022-05-02 MED ORDER — SUGAMMADEX SODIUM 200 MG/2ML IV SOLN
INTRAVENOUS | Status: DC | PRN
  Administered 2022-05-02: 195 mg via INTRAVENOUS

## 2022-05-02 MED ORDER — GABAPENTIN 300 MG PO CAPS
300.0000 mg | ORAL_CAPSULE | Freq: Once | ORAL | Status: AC
  Administered 2022-05-02: 300 mg via ORAL
  Filled 2022-05-02: qty 1

## 2022-05-02 MED ORDER — TRANEXAMIC ACID-NACL 1000-0.7 MG/100ML-% IV SOLN
1000.0000 mg | INTRAVENOUS | Status: AC
  Administered 2022-05-02: 1000 mg via INTRAVENOUS
  Filled 2022-05-02: qty 100

## 2022-05-02 SURGICAL SUPPLY — 53 items
BAG COUNTER SPONGE SURGICOUNT (BAG) ×2 IMPLANT
BAG SPNG CNTER NS LX DISP (BAG) ×1
BANDAGE ESMARK 6X9 LF (GAUZE/BANDAGES/DRESSINGS) IMPLANT
BNDG CMPR 5X6 CHSV STRCH STRL (GAUZE/BANDAGES/DRESSINGS) ×1
BNDG CMPR 9X6 STRL LF SNTH (GAUZE/BANDAGES/DRESSINGS)
BNDG COHESIVE 4X5 TAN STRL (GAUZE/BANDAGES/DRESSINGS) IMPLANT
BNDG COHESIVE 6X5 TAN ST LF (GAUZE/BANDAGES/DRESSINGS) IMPLANT
BNDG ESMARK 6X9 LF (GAUZE/BANDAGES/DRESSINGS)
BNDG GAUZE DERMACEA FLUFF 4 (GAUZE/BANDAGES/DRESSINGS) ×2 IMPLANT
BNDG GZE DERMACEA 4 6PLY (GAUZE/BANDAGES/DRESSINGS)
COVER MAYO STAND STRL (DRAPES) IMPLANT
COVER SURGICAL LIGHT HANDLE (MISCELLANEOUS) ×4 IMPLANT
CUFF TOURN SGL QUICK 34 (TOURNIQUET CUFF)
CUFF TOURN SGL QUICK 42 (TOURNIQUET CUFF) IMPLANT
CUFF TRNQT CYL 34X4.125X (TOURNIQUET CUFF) IMPLANT
DRAPE C-ARM 42X72 X-RAY (DRAPES) IMPLANT
DRAPE INCISE IOBAN 66X45 STRL (DRAPES) IMPLANT
DRAPE ORTHO SPLIT 77X108 STRL (DRAPES) ×1
DRAPE SURG ORHT 6 SPLT 77X108 (DRAPES) IMPLANT
DRAPE U-SHAPE 47X51 STRL (DRAPES) ×2 IMPLANT
DRESSING PEEL AND PLC PRVNA 13 (GAUZE/BANDAGES/DRESSINGS) IMPLANT
DRSG EMULSION OIL 3X3 NADH (GAUZE/BANDAGES/DRESSINGS) ×2 IMPLANT
DRSG PEEL AND PLACE PREVENA 13 (GAUZE/BANDAGES/DRESSINGS) ×1
DURAPREP 26ML APPLICATOR (WOUND CARE) ×2 IMPLANT
ELECT REM PT RETURN 9FT ADLT (ELECTROSURGICAL) ×1
ELECTRODE REM PT RTRN 9FT ADLT (ELECTROSURGICAL) ×2 IMPLANT
GAUZE PAD ABD 8X10 STRL (GAUZE/BANDAGES/DRESSINGS) ×2 IMPLANT
GAUZE SPONGE 4X4 12PLY STRL (GAUZE/BANDAGES/DRESSINGS) ×2 IMPLANT
GLOVE BIOGEL PI IND STRL 9 (GLOVE) ×2 IMPLANT
GLOVE SURG ORTHO 9.0 STRL STRW (GLOVE) ×2 IMPLANT
GOWN STRL REUS W/ TWL XL LVL3 (GOWN DISPOSABLE) ×6 IMPLANT
GOWN STRL REUS W/TWL XL LVL3 (GOWN DISPOSABLE) ×3
KIT BASIN OR (CUSTOM PROCEDURE TRAY) ×2 IMPLANT
KIT STIMULAN RAPID CURE  10CC (Orthopedic Implant) IMPLANT
KIT STIMULAN RAPID CURE 5CC (Orthopedic Implant) IMPLANT
KIT TURNOVER KIT B (KITS) ×2 IMPLANT
MANIFOLD NEPTUNE II (INSTRUMENTS) ×2 IMPLANT
NS IRRIG 1000ML POUR BTL (IV SOLUTION) ×2 IMPLANT
PACK ORTHO EXTREMITY (CUSTOM PROCEDURE TRAY) ×2 IMPLANT
PAD ARMBOARD 7.5X6 YLW CONV (MISCELLANEOUS) ×4 IMPLANT
SPONGE T-LAP 18X18 ~~LOC~~+RFID (SPONGE) IMPLANT
STAPLER VISISTAT 35W (STAPLE) IMPLANT
STOCKINETTE IMPERVIOUS 9X36 MD (GAUZE/BANDAGES/DRESSINGS) IMPLANT
SUT ETHILON 2 0 PSLX (SUTURE) IMPLANT
SUT ETHILON 3 0 PS 1 (SUTURE) IMPLANT
SUT VIC AB 0 CT1 27 (SUTURE) ×1
SUT VIC AB 0 CT1 27XBRD ANBCTR (SUTURE) IMPLANT
SUT VIC AB 2-0 CT1 27 (SUTURE) ×1
SUT VIC AB 2-0 CT1 TAPERPNT 27 (SUTURE) IMPLANT
TOWEL GREEN STERILE (TOWEL DISPOSABLE) ×2 IMPLANT
TOWEL GREEN STERILE FF (TOWEL DISPOSABLE) ×2 IMPLANT
UNDERPAD 30X36 HEAVY ABSORB (UNDERPADS AND DIAPERS) ×2 IMPLANT
WATER STERILE IRR 1000ML POUR (IV SOLUTION) ×2 IMPLANT

## 2022-05-02 NOTE — Anesthesia Preprocedure Evaluation (Addendum)
Anesthesia Evaluation  Patient identified by MRN, date of birth, ID band Patient awake    Reviewed: Allergy & Precautions, NPO status , Patient's Chart, lab work & pertinent test results  Airway Mallampati: II  TM Distance: >3 FB Neck ROM: Full    Dental no notable dental hx. (+) Teeth Intact   Pulmonary neg pulmonary ROS   Pulmonary exam normal breath sounds clear to auscultation       Cardiovascular negative cardio ROS Normal cardiovascular exam Rhythm:Regular Rate:Normal     Neuro/Psych negative neurological ROS  negative psych ROS   GI/Hepatic Neg liver ROS,,,S/P lap gastric banding    Endo/Other  Obesity  Renal/GU negative Renal ROS  negative genitourinary   Musculoskeletal  (+) Arthritis , Osteoarthritis,  Painful/symptomatic hardware left elbow   Abdominal  (+) + obese  Peds  Hematology  (+) Blood dyscrasia, anemia   Anesthesia Other Findings   Reproductive/Obstetrics                             Anesthesia Physical Anesthesia Plan  ASA: 2  Anesthesia Plan: General   Post-op Pain Management: Regional block* and Minimal or no pain anticipated   Induction: Intravenous  PONV Risk Score and Plan: 4 or greater and Treatment may vary due to age or medical condition, Midazolam, Ondansetron and Dexamethasone  Airway Management Planned: LMA  Additional Equipment: None  Intra-op Plan:   Post-operative Plan: Extubation in OR  Informed Consent: I have reviewed the patients History and Physical, chart, labs and discussed the procedure including the risks, benefits and alternatives for the proposed anesthesia with the patient or authorized representative who has indicated his/her understanding and acceptance.     Dental advisory given  Plan Discussed with: Anesthesiologist and CRNA  Anesthesia Plan Comments:         Anesthesia Quick Evaluation

## 2022-05-02 NOTE — Op Note (Signed)
   Date of Surgery: 05/02/2022  INDICATIONS: Patty [name] is a 52 y.o.-year-old female with symptomatic left olecranon bursitis in the setting of proud K wires.  The risk and benefits of the procedure were discussed in detail and documented in the pre-operative evaluation.   PREOPERATIVE DIAGNOSIS: 1.  Left olecranon bursitis with symptomatic hardware  POSTOPERATIVE DIAGNOSIS: Same.  PROCEDURE: 1.  Left olecranon bursitis 2.  Symptomatic left olecranon hardware  SURGEON: Patty Deeds MD  ASSISTANT: Kerby Less, ATC  ANESTHESIA:  general plus peripheral nerve block  IV FLUIDS AND URINE: See anesthesia record.  ANTIBIOTICS: Ancef  ESTIMATED BLOOD LOSS: 50 mL.  IMPLANTS:  * No implants in log *  DRAINS: None  CULTURES: None  COMPLICATIONS: none  DESCRIPTION OF PROCEDURE:   I identified the patient in the pre-operative holding area.  I marked the operative knee with my initials. I reviewed the risks and benefits of the proposed surgical intervention and the patient wished to proceed.  Anesthesia performed a peripheral nerve block.  Patient was subsequently taken back to the operating room.  The patient was transferred to the operative suite and placed in the supine position with all bony prominences padded.     SCDs were placed on the non-operative lower extremity. Appropriate antibiotics was administered within 1 hour before incision.  She was placed in the lateral decubitus position.  Axillary roll small bony prominences were padded.  She was prepped and draped in usual sterile fashion.  Final timeout was performed.  15 blade was used to incise right over the tip of the olecranon.  There was a previous sinus tract that was excised.  The bursa was then sharply dissected with a 15 blade and tenotomy scissors.  This was all excised.  Fluid was sent for culture.  The fluid appeared red and not infected.  As result the decision was made to remove only the olecranon wires.  These were  removed with a needle driver.  The cerclage wire was then pulled out from the bone.  The wound was thoroughly irrigated.  Hemostasis was achieved with electrocautery.  That concluded the case.  Skin was closed with 2-0 Vicryl and 3-0 nylon in vertical mattress form..  A wound VAC was placed over the incision with excellent suction.  The arm was wrapped with Coban.  Instrument, sponge, and needle counts were correct prior to wound closure and at the conclusion of the case.  The patient was taken to the PACU without complication     POSTOPERATIVE PLAN: She will be weightbearing as tolerated on the left arm once her block wears off.  I will plan to see her back in 1 week for wound check and vac removal  Patty Deeds, MD 3:28 PM

## 2022-05-02 NOTE — Brief Op Note (Signed)
   Brief Op Note  Date of Surgery: 05/02/2022  Preoperative Diagnosis: SYMPTOMATIC HARDWARE OF LEFT ELBOW  Postoperative Diagnosis: same  Procedure: Procedure(s): LEFT ELBOW HARDWARE REMOVAL  Implants: * No implants in log *  Surgeons: Surgeon(s): Huel Cote, MD  Anesthesia: General    Estimated Blood Loss: See anesthesia record  Complications: None  Condition to PACU: Stable  Benancio Deeds, MD 05/02/2022 3:28 PM

## 2022-05-02 NOTE — Transfer of Care (Signed)
Immediate Anesthesia Transfer of Care Note  Patient: Patty [name]  Procedure(s) Performed: LEFT ELBOW HARDWARE REMOVAL (Left: Elbow)  Patient Location: PACU  Anesthesia Type:GA combined with regional for post-op pain  Level of Consciousness: drowsy  Airway & Oxygen Therapy: Patient Spontanous Breathing  Post-op Assessment: Report given to RN and Post -op Vital signs reviewed and stable  Post vital signs: Reviewed and stable  Last Vitals:  Vitals Value Taken Time  BP 126/76 05/02/22 1525  Temp 98   Pulse 88   Resp 13 05/02/22 1527  SpO2 100   Vitals shown include unvalidated device data.  Last Pain:  Vitals:   05/02/22 1249  TempSrc:   PainSc: 2       Patients Stated Pain Goal: 2 (05/02/22 1249)  Complications: No notable events documented.

## 2022-05-02 NOTE — Discharge Instructions (Signed)
     Discharge Instructions    Attending Surgeon: Huel Cote, MD Office Phone Number: [phone]   Diagnosis and Procedures:    Surgeries Performed: Left elbow removal of hardware with bursectomy  Discharge Plan:    Diet: Resume usual diet. Begin with light or bland foods.  Drink plenty of fluids.  Activity:  Activity as tolerated. You are advised to go home directly from the hospital or surgical center. Restrict your activities.  GENERAL INSTRUCTIONS: 1.  Please apply ice to your wound to help with swelling and inflammation. This will improve your comfort and your overall recovery following surgery.     2. Please call Dr. Serena Croissant office at [phone] with questions Monday-Friday during business hours. If no one answers, please leave a message and someone should get back to the patient within 24 hours. For emergencies please call 911 or proceed to the emergency room.   3. Patient to notify surgical team if experiences any of the following: Bowel/Bladder dysfunction, uncontrolled pain, nerve/muscle weakness, incision with increased drainage or redness, nausea/vomiting and Fever greater than 101.0 F.  Be alert for signs of infection including redness, streaking, odor, fever or chills. Be alert for excessive pain or bleeding and notify your surgeon immediately.  WOUND INSTRUCTIONS:   Leave your dressing, cast, or splint in place until your post operative visit.  Keep it clean and dry.  Always keep the incision clean and dry until the staples/sutures are removed. If there is no drainage from the incision you should keep it open to air. If there is drainage from the incision you must keep it covered at all times until the drainage stops  Do not soak in a bath tub, hot tub, pool, lake or other body of water until 21 days after your surgery and your incision is completely dry and healed.  If you have removable sutures (or staples) they must be removed 10-14 days (unless  otherwise instructed) from the day of your surgery.     1)  Elevate the extremity as much as possible.  2)  Keep the dressing clean and dry.  3)  Please call us if the dressing becomes wet or dirty.  4)  If you are experiencing worsening pain or worsening swelling, please call.     MEDICATIONS: Resume all previous home medications at the previous prescribed dose and frequency unless otherwise noted Start taking the  pain medications on an as-needed basis as prescribed  Please taper down pain medication over the next week following surgery.  Ideally you should not require a refill of any narcotic pain medication.  Take pain medication with food to minimize nausea. In addition to the prescribed pain medication, you may take over-the-counter pain relievers such as Tylenol.  Do NOT take additional tylenol if your pain medication already has tylenol in it.  Aspirin 325mg  daily for four weeks.      FOLLOWUP INSTRUCTIONS: 1. Follow up at the Physical Therapy Clinic 3-4 days following surgery. This appointment should be scheduled unless other arrangements have been made.The Physical Therapy scheduling number is [phone] if an appointment has not already been arranged.  2. Contact Dr. Serena Croissant office during office hours at [phone] or the practice after hours line at [phone] for non-emergencies. For medical emergencies call 911.   Discharge Location: Home

## 2022-05-02 NOTE — Anesthesia Procedure Notes (Signed)
Anesthesia Regional Block: Supraclavicular block   Pre-Anesthetic Checklist: , timeout performed,  Correct Patient, Correct Site, Correct Laterality,  Correct Procedure, Correct Position, site marked,  Risks and benefits discussed,  Surgical consent,  Pre-op evaluation,  At surgeon's request and post-op pain management  Laterality: Left  Prep: chloraprep       Needles:  Injection technique: Single-shot  Needle Type: Echogenic Stimulator Needle     Needle Length: 10cm  Needle Gauge: 21   Needle insertion depth: 7 cm   Additional Needles:     Motor weakness within 7 minutes.  Narrative:  Start time: 05/02/2022 1:42 PM End time: 05/02/2022 1:47 PM Injection made incrementally with aspirations every 5 mL.  Performed by: Personally  Anesthesiologist: Mal Amabile, MD  Additional Notes: Timeout performed. Patient sedated. Relevant anatomy ID'd using [place]. Incremental 2-26ml injection of LA with frequent aspiration. Patient tolerated procedure well.

## 2022-05-02 NOTE — Interval H&P Note (Signed)
History and Physical Interval Note:  05/02/2022 12:45 PM  Patty [name]  has presented today for surgery, with the diagnosis of SYMPTOMATIC HARDWARE OF LEFT ELBOW.  The various methods of treatment have been discussed with the patient and family. After consideration of risks, benefits and other options for treatment, the patient has consented to  Procedure(s): LEFT ELBOW HARDWARE REMOVAL (Left) as a surgical intervention.  The patient's history has been reviewed, patient examined, no change in status, stable for surgery.  I have reviewed the patient's chart and labs.  Questions were answered to the patient's satisfaction.     Huel Cote

## 2022-05-02 NOTE — Anesthesia Procedure Notes (Signed)
Procedure Name: Intubation Date/Time: 05/02/2022 2:20 PM  Performed by: Darryl Nestle, CRNAPre-anesthesia Checklist: Patient identified, Emergency Drugs available, Suction available and Patient being monitored Patient Re-evaluated:Patient Re-evaluated prior to induction Oxygen Delivery Method: Circle system utilized Preoxygenation: Pre-oxygenation with 100% oxygen Induction Type: IV induction Ventilation: Mask ventilation without difficulty Laryngoscope Size: Mac and 4 Grade View: Grade I Tube type: Oral Tube size: 7.0 mm Number of attempts: 1 Airway Equipment and Method: Stylet and Oral airway Placement Confirmation: ETT inserted through vocal cords under direct vision, positive ETCO2 and breath sounds checked- equal and bilateral Secured at: 22 cm Tube secured with: Tape Dental Injury: Teeth and Oropharynx as per pre-operative assessment

## 2022-05-05 NOTE — Anesthesia Postprocedure Evaluation (Signed)
Anesthesia Post Note  Patient: Patty [name]  Procedure(s) Performed: LEFT ELBOW HARDWARE REMOVAL (Left: Elbow)     Patient location during evaluation: PACU Anesthesia Type: General Level of consciousness: sedated and patient cooperative Pain management: pain level controlled Vital Signs Assessment: post-procedure vital signs reviewed and stable Respiratory status: spontaneous breathing Cardiovascular status: stable Anesthetic complications: no   No notable events documented.  Last Vitals:  Vitals:   05/02/22 1600 05/02/22 1615  BP: 104/71 114/75  Pulse: 78 73  Resp: 11 12  Temp:  36.8 C  SpO2: 98% 97%    Last Pain:  Vitals:   05/02/22 1615  TempSrc:   PainSc: 0-No pain                 Lewie Loron

## 2022-05-09 ENCOUNTER — Ambulatory Visit (INDEPENDENT_AMBULATORY_CARE_PROVIDER_SITE_OTHER): Payer: 59 | Admitting: Orthopaedic Surgery

## 2022-05-09 DIAGNOSIS — M7022 Olecranon bursitis, left elbow: Secondary | ICD-10-CM

## 2022-05-09 NOTE — Progress Notes (Signed)
Chief Complaint: 1 week status post left elbow removal of hardware and bursal debridement     History of Present Illness:    Patty [name] is a 52 y.o. female presents today 1 week status post the above procedure.  Wound vacs been in place without any drainage.  She is here today for further assessment.  Overall doing quite well.  Denies any drainage from the wound.    Surgical History:   None  PMH/PSH/Family History/Social History/Meds/Allergies:    Past Medical History:  Diagnosis Date   Anemia    negative work up at [facility]   Arthritis    Past Surgical History:  Procedure Laterality Date   HARDWARE REMOVAL Left 05/02/2022   Procedure: LEFT ELBOW HARDWARE REMOVAL;  Surgeon: Huel Cote, MD;  Location: [facility] OR;  Service: Orthopedics;  Laterality: Left;   IRRIGATION AND DEBRIDEMENT SEBACEOUS CYST     bilateral axilla   LAPAROSCOPIC GASTRIC BANDING  2010   ORIF ANKLE FRACTURE  05/01/2011   Procedure: OPEN REDUCTION INTERNAL FIXATION (ORIF) ANKLE FRACTURE;  Surgeon: Nestor Lewandowsky, MD;  Location: [facility] OR;  Service: Orthopedics;  Laterality: Right;   ORIF HUMERUS FRACTURE  05/01/2011   Procedure: OPEN REDUCTION INTERNAL FIXATION (ORIF) DISTAL HUMERUS FRACTURE;  Surgeon: Marlowe Shores, MD;  Location: [facility] OR;  Service: Orthopedics;  Laterality: Left;   OTHER SURGICAL HISTORY     lap banding   Social History   Socioeconomic History   Marital status: Legally Separated    Spouse name: Not on file   Number of children: Not on file   Years of education: Not on file   Highest education level: Not on file  Occupational History   Not on file  Tobacco Use   Smoking status: Never   Smokeless tobacco: Never  Vaping Use   Vaping Use: Never used  Substance and Sexual Activity   Alcohol use: No   Drug use: No   Sexual activity: Not Currently  Other Topics Concern   Not on file  Social History Narrative   Not on file   Social Determinants  of Health   Financial Resource Strain: Not on file  Food Insecurity: Not on file  Transportation Needs: Not on file  Physical Activity: Not on file  Stress: Not on file  Social Connections: Not on file   Family History  Problem Relation Age of Onset   Seizures Mother 30   No Known Allergies Current Outpatient Medications  Medication Sig Dispense Refill   acetaminophen (TYLENOL) 500 MG tablet Take 1 tablet (500 mg total) by mouth every 8 (eight) hours for 10 days. (Patient taking differently: Take 500 mg by mouth every 8 (eight) hours as needed for mild pain.) 30 tablet 0   diclofenac (VOLTAREN) 75 MG EC tablet Take 75 mg by mouth 1 day or 1 dose.     folic acid (FOLVITE) 1 MG tablet Take 1 tablet (1 mg total) by mouth daily. 30 tablet 11   ibuprofen (ADVIL) 800 MG tablet Take 1 tablet (800 mg total) by mouth every 8 (eight) hours for 10 days. Please take with food, please alternate with acetaminophen 30 tablet 0   medroxyPROGESTERone (DEPO-PROVERA) 150 MG/ML injection      moxifloxacin (AVELOX) 400 MG tablet Take 400 mg by mouth 2 (two) times  daily as needed.     oxyCODONE (OXY IR/ROXICODONE) 5 MG immediate release tablet Take 1 tablet (5 mg total) by mouth every 4 (four) hours as needed (severe pain). 5 tablet 0   spironolactone (ALDACTONE) 50 MG tablet Take 50 mg by mouth daily.     No current facility-administered medications for this visit.   No results found.  Review of Systems:   A ROS was performed including pertinent positives and negatives as documented in the HPI.  Physical Exam :   Constitutional: NAD and appears stated age Neurological: Alert and oriented Psych: Appropriate affect and cooperative Last menstrual period 04/11/2011.   Comprehensive Musculoskeletal Exam:    Left elbow range of motion is 0 to 140 degrees.  Wound is well-healing without any drainage.  There is nothing in the wound VAC canister.  There is a small amount of residual swelling about the  posterior olecranon there is no redness or drainage.  She is actively able to extend.  Full pro supination.  Imaging:   Xray (3 views left elbow): There is displacement of her previous K wires in her olecranon fixation   I personally reviewed and interpreted the radiographs.   Assessment:   52 y.o. female with status left olecranon bursectomy and removal of hardware.  Overall the wound does look very good at today's visit.  We have removed her sutures and wound VAC.  Will plan to get her into a compressive brace.  I will plan to see her back in 4 weeks for reassessment  Plan :    -Return to clinic in 4 weeks for reassessment   I personally saw and evaluated the patient, and participated in the management and treatment plan.  Huel Cote, MD Attending Physician, Orthopedic Surgery  This document was dictated using Dragon voice recognition software. A reasonable attempt at proof reading has been made to minimize errors.

## 2022-05-22 ENCOUNTER — Encounter (HOSPITAL_BASED_OUTPATIENT_CLINIC_OR_DEPARTMENT_OTHER): Payer: 59 | Admitting: Orthopaedic Surgery

## 2022-06-05 ENCOUNTER — Ambulatory Visit (INDEPENDENT_AMBULATORY_CARE_PROVIDER_SITE_OTHER): Payer: 59 | Admitting: Orthopaedic Surgery

## 2022-06-05 DIAGNOSIS — M7022 Olecranon bursitis, left elbow: Secondary | ICD-10-CM

## 2022-06-05 NOTE — Progress Notes (Signed)
Chief Complaint:  status post left elbow removal of hardware and bursal debridement     History of Present Illness:    Patty [name] is a 52 y.o. female presents today 6 weeks status post left elbow removal of hardware.  Overall she is doing very well.  She is been wearing a compressive sleeve.  This is been improving her swelling dramatically.    Surgical History:   None  PMH/PSH/Family History/Social History/Meds/Allergies:    Past Medical History:  Diagnosis Date   Anemia    negative work up at [facility]   Arthritis    Past Surgical History:  Procedure Laterality Date   HARDWARE REMOVAL Left 05/02/2022   Procedure: LEFT ELBOW HARDWARE REMOVAL;  Surgeon: Huel Cote, MD;  Location: [facility] OR;  Service: Orthopedics;  Laterality: Left;   IRRIGATION AND DEBRIDEMENT SEBACEOUS CYST     bilateral axilla   LAPAROSCOPIC GASTRIC BANDING  2010   ORIF ANKLE FRACTURE  05/01/2011   Procedure: OPEN REDUCTION INTERNAL FIXATION (ORIF) ANKLE FRACTURE;  Surgeon: Nestor Lewandowsky, MD;  Location: [facility] OR;  Service: Orthopedics;  Laterality: Right;   ORIF HUMERUS FRACTURE  05/01/2011   Procedure: OPEN REDUCTION INTERNAL FIXATION (ORIF) DISTAL HUMERUS FRACTURE;  Surgeon: Marlowe Shores, MD;  Location: [facility] OR;  Service: Orthopedics;  Laterality: Left;   OTHER SURGICAL HISTORY     lap banding   Social History   Socioeconomic History   Marital status: Legally Separated    Spouse name: Not on file   Number of children: Not on file   Years of education: Not on file   Highest education level: Not on file  Occupational History   Not on file  Tobacco Use   Smoking status: Never   Smokeless tobacco: Never  Vaping Use   Vaping Use: Never used  Substance and Sexual Activity   Alcohol use: No   Drug use: No   Sexual activity: Not Currently  Other Topics Concern   Not on file  Social History Narrative   Not on file   Social Determinants of Health    Financial Resource Strain: Not on file  Food Insecurity: Not on file  Transportation Needs: Not on file  Physical Activity: Not on file  Stress: Not on file  Social Connections: Not on file   Family History  Problem Relation Age of Onset   Seizures Mother 30   No Known Allergies Current Outpatient Medications  Medication Sig Dispense Refill   acetaminophen (TYLENOL) 500 MG tablet Take 1 tablet (500 mg total) by mouth every 8 (eight) hours for 10 days. (Patient taking differently: Take 500 mg by mouth every 8 (eight) hours as needed for mild pain.) 30 tablet 0   diclofenac (VOLTAREN) 75 MG EC tablet Take 75 mg by mouth 1 day or 1 dose.     folic acid (FOLVITE) 1 MG tablet Take 1 tablet (1 mg total) by mouth daily. 30 tablet 11   ibuprofen (ADVIL) 800 MG tablet Take 1 tablet (800 mg total) by mouth every 8 (eight) hours for 10 days. Please take with food, please alternate with acetaminophen 30 tablet 0   medroxyPROGESTERone (DEPO-PROVERA) 150 MG/ML injection      moxifloxacin (AVELOX) 400 MG tablet Take 400 mg by mouth 2 (two) times daily as needed.  oxyCODONE (OXY IR/ROXICODONE) 5 MG immediate release tablet Take 1 tablet (5 mg total) by mouth every 4 (four) hours as needed (severe pain). 5 tablet 0   spironolactone (ALDACTONE) 50 MG tablet Take 50 mg by mouth daily.     No current facility-administered medications for this visit.   No results found.  Review of Systems:   A ROS was performed including pertinent positives and negatives as documented in the HPI.  Physical Exam :   Constitutional: NAD and appears stated age Neurological: Alert and oriented Psych: Appropriate affect and cooperative Last menstrual period 04/11/2011.   Comprehensive Musculoskeletal Exam:    Left elbow range of motion is 0 to 140 degrees.  Wound is well-healing without any drainage.  There is nothing in the wound VAC canister.  There is a small amount of residual swelling about the posterior  olecranon there is no redness or drainage.  She is actively able to extend.  Full pro supination.  Imaging:   Xray (3 views left elbow): There is displacement of her previous K wires in her olecranon fixation   I personally reviewed and interpreted the radiographs.   Assessment:   52 y.o. female with status left olecranon bursectomy and removal of hardware.  She is doing very well at today's visit.  Her swelling is improved dramatically.  She will return to clinic as needed  Plan :    -Return to clinic as needed   I personally saw and evaluated the patient, and participated in the management and treatment plan.  Huel Cote, MD Attending Physician, Orthopedic Surgery  This document was dictated using Dragon voice recognition software. A reasonable attempt at proof reading has been made to minimize errors.

## 2023-03-03 ENCOUNTER — Telehealth: Payer: 59 | Admitting: Physician Assistant

## 2023-03-03 DIAGNOSIS — R4189 Other symptoms and signs involving cognitive functions and awareness: Secondary | ICD-10-CM | POA: Diagnosis not present

## 2023-03-03 NOTE — Patient Instructions (Signed)
 Patty [name], thank you for joining Angelia Kelp, PA-C for today's virtual visit.  While this provider is not your primary care provider (PCP), if your PCP is located in our provider database this encounter information will be shared with them immediately following your visit.   A [facility] MyChart account gives you access to today's visit and all your visits, tests, and labs performed at [facility] " click here if you don't have a [facility] MyChart account or go to [email or web address]  Consent: (Patient) Patty [name] provided verbal consent for this virtual visit at the beginning of the encounter.  Current Medications:  Current Outpatient Medications:    diclofenac (VOLTAREN) 75 MG EC tablet, Take 75 mg by mouth 1 day or 1 dose., Disp: , Rfl:    folic acid  (FOLVITE ) 1 MG tablet, Take 1 tablet (1 mg total) by mouth daily., Disp: 30 tablet, Rfl: 11   medroxyPROGESTERone (DEPO-PROVERA) 150 MG/ML injection, , Disp: , Rfl:    moxifloxacin (AVELOX) 400 MG tablet, Take 400 mg by mouth 2 (two) times daily as needed., Disp: , Rfl:    oxyCODONE  (OXY IR/ROXICODONE ) 5 MG immediate release tablet, Take 1 tablet (5 mg total) by mouth every 4 (four) hours as needed (severe pain)., Disp: 5 tablet, Rfl: 0   spironolactone (ALDACTONE) 50 MG tablet, Take 50 mg by mouth daily., Disp: , Rfl:    Medications ordered in this encounter:  No orders of the defined types were placed in this encounter.    *If you need refills on other medications prior to your next appointment, please contact your pharmacy*  Follow-Up: Call back or seek an in-person evaluation if the symptoms worsen or if the condition fails to improve as anticipated.  [facility] [phone]  Other Instructions  Menopause: What to Know Menopause is the time in your life when your menstrual periods stop. It marks the end of your ability to get pregnant. It can be defined as not having a  period for 12 months without another medical cause. The time when you start to move into menopause is called perimenopause. It often happens between ages 30-55. It can last for many years. During perimenopause, hormone levels change in your body. This can cause symptoms and affect your health. Menopause may make you more likely to have: Bones that are weak and break more easily. Depression. This is when you feel sad or hopeless. Arteries that harden and get narrow. These can cause heart attacks and strokes. What are the causes? In most cases, menopause is a natural change to your body and hormone levels that happens as you get older. But in some cases, it may be caused by changes that aren't natural. These include: Surgery to take out both ovaries. Side effects from some medicines. What increases the risk? You're more likely to go through menopause early if: You have an abnormal growth (tumor) of the pituitary gland in your brain. You have a disease that affects your ovaries. You've had certain treatments for cancer. These include: Chemotherapy. Hormone therapy. Radiation therapy on the area between your hips (pelvis). You smoke a lot or drink a lot of alcohol. Other people in your family have gone through menopause early. You're very thin. What are the signs or symptoms? You may have: Hot flashes. Irregular periods. Night sweats. Changes in how you feel about sex. You may: Have less of a sex drive. Feel more discomfort around your sexuality. Vaginal dryness and thinning of  the vaginal walls. This may make it hurt to have sex. Skin changes, such as: Dry skin. New wrinkles. Headaches. Other symptoms may include: Trouble sleeping. Mood swings. Memory problems. Weight gain. Hair growth on your face and chest. Bladder infections or trouble peeing. How is this diagnosed? You may be diagnosed based on: Your medical history. An exam. Your age. Your history of menstrual  periods. Your symptoms. Hormone tests. How is this treated? In some cases, no treatment is needed. Talk with your health care provider about if you should get treated. Treatments may include: Menopausal hormone therapy (MHT). Medicines to treat certain symptoms. Acupuncture. Vitamin or herbal supplements. Before you start treatment, let your provider know if you or anyone in your family has or has had: Heart disease. Breast cancer. Blood clots. Diabetes. Osteoporosis. Follow these instructions at home: Eating and drinking  Eat a balanced diet. It should include: Fresh fruits and vegetables. Whole grains. Lean protein. Low-fat dairy. Eat lots of foods that have calcium and vitamin D in them. These can help keep your bones healthy. Foods and drinks that are rich in calcium include: Yogurt and low-fat milk. Beans. Almonds. Sardines. Broccoli and kale. To help prevent hot flashes, stay away from: Alcohol. Drinks with caffeine in them. Spicy foods. Lifestyle Do not smoke, vape, or use nicotine or tobacco. Get 7-8 hours of sleep each night. If you have hot flashes, you may want to: Dress in layers. Avoid things that may trigger hot flashes, like warm places or stress. Take slow, deep breaths when a hot flash starts. Keep a fan in your home and office. Find ways to manage stress. You may want to try: Deep breathing. Meditation. Writing in a journal. Ask your provider about going to group therapy. Therapy can help you get support from others who are going through menopause. General instructions  Talk with your provider before you take any herbal supplements. Keep track of your symptoms. Track: When they start. How often you have them. How long they last. Use vaginal lubricants or moisturizers. These can help with: Vaginal dryness. Comfort during sex. Contact a health care provider if: You're older than 55 and still get periods. You have pain during sex. You haven't  had a period for 12 months and then start to bleed from your vagina. It hurts to pee. You get very bad headaches. Get help right away if: You're very depressed. You have a lot of bleeding from your vagina. Your heart is beating too fast. You have very bad belly pain or indigestion that doesn't go away with medicines. This information is not intended to replace advice given to you by your health care provider. Make sure you discuss any questions you have with your health care provider. Document Revised: 09/12/2022 Document Reviewed: 09/12/2022 Elsevier Patient Education  2024 [employer]vier [employer].   If you have been instructed to have an in-person evaluation today at a local Urgent Care facility, please use the link below. It will take you to a list of all of our available [facility], including address, phone number and hours of operation. Please do not delay care.  [facility]  If you or a family member do not have a primary care provider, use the link below to schedule a visit and establish care. When you choose a [facility] primary care physician or advanced practice provider, you gain a long-term partner in health. Find a Primary Care Provider  Learn more about [facility] in-office and virtual  care options: [facility] - Get Care Now
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
